# Patient Record
Sex: Male | Born: 1950
Health system: Southern US, Community
[De-identification: ages and names within clinical notes are randomized; demographics above are authoritative.]

## PROBLEM LIST (undated history)

## (undated) DIAGNOSIS — J45909 Unspecified asthma, uncomplicated: Secondary | ICD-10-CM

## (undated) DIAGNOSIS — R519 Headache, unspecified: Secondary | ICD-10-CM

## (undated) DIAGNOSIS — C4491 Basal cell carcinoma of skin, unspecified: Secondary | ICD-10-CM

## (undated) DIAGNOSIS — R1310 Dysphagia, unspecified: Principal | ICD-10-CM

## (undated) DIAGNOSIS — I639 Cerebral infarction, unspecified: Secondary | ICD-10-CM

## (undated) DIAGNOSIS — K219 Gastro-esophageal reflux disease without esophagitis: Secondary | ICD-10-CM

## (undated) DIAGNOSIS — I341 Nonrheumatic mitral (valve) prolapse: Secondary | ICD-10-CM

## (undated) DIAGNOSIS — M533 Sacrococcygeal disorders, not elsewhere classified: Secondary | ICD-10-CM

## (undated) DIAGNOSIS — C801 Malignant (primary) neoplasm, unspecified: Secondary | ICD-10-CM

## (undated) DIAGNOSIS — R51 Headache: Secondary | ICD-10-CM

## (undated) DIAGNOSIS — Z0000 Encounter for general adult medical examination without abnormal findings: Secondary | ICD-10-CM

## (undated) DIAGNOSIS — E782 Mixed hyperlipidemia: Secondary | ICD-10-CM

## (undated) DIAGNOSIS — I340 Nonrheumatic mitral (valve) insufficiency: Secondary | ICD-10-CM

## (undated) DIAGNOSIS — R0781 Pleurodynia: Secondary | ICD-10-CM

## (undated) DIAGNOSIS — R011 Cardiac murmur, unspecified: Secondary | ICD-10-CM

## (undated) DIAGNOSIS — L309 Dermatitis, unspecified: Secondary | ICD-10-CM

## (undated) DIAGNOSIS — E785 Hyperlipidemia, unspecified: Secondary | ICD-10-CM

## (undated) DIAGNOSIS — R351 Nocturia: Secondary | ICD-10-CM

## (undated) DIAGNOSIS — M545 Low back pain: Secondary | ICD-10-CM

## (undated) DIAGNOSIS — T7840XA Allergy, unspecified, initial encounter: Secondary | ICD-10-CM

## (undated) HISTORY — DX: Headache: R51

## (undated) HISTORY — DX: Basal cell carcinoma of skin, unspecified: C44.91

## (undated) HISTORY — DX: Gastro-esophageal reflux disease without esophagitis: K21.9

## (undated) HISTORY — DX: Hyperlipidemia, unspecified: E78.5

## (undated) HISTORY — DX: Sacrococcygeal disorders, not elsewhere classified: M53.3

## (undated) HISTORY — DX: Headache, unspecified: R51.9

## (undated) HISTORY — DX: Cardiac murmur, unspecified: R01.1

## (undated) HISTORY — DX: Dysphagia, unspecified: R13.10

## (undated) HISTORY — DX: Encounter for general adult medical examination without abnormal findings: Z00.00

## (undated) HISTORY — DX: Low back pain: M54.5

## (undated) HISTORY — DX: Allergy, unspecified, initial encounter: T78.40XA

## (undated) HISTORY — DX: Mixed hyperlipidemia: E78.2

## (undated) HISTORY — DX: Dermatitis, unspecified: L30.9

## (undated) HISTORY — DX: Malignant (primary) neoplasm, unspecified: C80.1

## (undated) HISTORY — DX: Pleurodynia: R07.81

## (undated) HISTORY — DX: Nocturia: R35.1

---

## 2013-10-29 DIAGNOSIS — F329 Major depressive disorder, single episode, unspecified: Secondary | ICD-10-CM | POA: Diagnosis not present

## 2013-10-29 DIAGNOSIS — R9430 Abnormal result of cardiovascular function study, unspecified: Secondary | ICD-10-CM | POA: Diagnosis not present

## 2013-10-29 DIAGNOSIS — H908 Mixed conductive and sensorineural hearing loss, unspecified: Secondary | ICD-10-CM | POA: Diagnosis not present

## 2013-10-29 DIAGNOSIS — R0609 Other forms of dyspnea: Secondary | ICD-10-CM | POA: Diagnosis not present

## 2013-10-29 DIAGNOSIS — Z135 Encounter for screening for eye and ear disorders: Secondary | ICD-10-CM | POA: Diagnosis not present

## 2013-10-29 DIAGNOSIS — N400 Enlarged prostate without lower urinary tract symptoms: Secondary | ICD-10-CM | POA: Diagnosis not present

## 2013-10-29 DIAGNOSIS — F3289 Other specified depressive episodes: Secondary | ICD-10-CM | POA: Diagnosis not present

## 2013-10-29 DIAGNOSIS — R030 Elevated blood-pressure reading, without diagnosis of hypertension: Secondary | ICD-10-CM | POA: Diagnosis not present

## 2013-10-29 DIAGNOSIS — Z0000 Encounter for general adult medical examination without abnormal findings: Secondary | ICD-10-CM | POA: Diagnosis not present

## 2013-11-06 DIAGNOSIS — Z0000 Encounter for general adult medical examination without abnormal findings: Secondary | ICD-10-CM | POA: Diagnosis not present

## 2013-11-06 DIAGNOSIS — L821 Other seborrheic keratosis: Secondary | ICD-10-CM | POA: Diagnosis not present

## 2013-11-06 DIAGNOSIS — C44319 Basal cell carcinoma of skin of other parts of face: Secondary | ICD-10-CM | POA: Diagnosis not present

## 2013-11-06 DIAGNOSIS — E7800 Pure hypercholesterolemia, unspecified: Secondary | ICD-10-CM | POA: Diagnosis not present

## 2013-11-06 DIAGNOSIS — I10 Essential (primary) hypertension: Secondary | ICD-10-CM | POA: Diagnosis not present

## 2013-11-06 DIAGNOSIS — L570 Actinic keratosis: Secondary | ICD-10-CM | POA: Diagnosis not present

## 2013-11-06 DIAGNOSIS — D485 Neoplasm of uncertain behavior of skin: Secondary | ICD-10-CM | POA: Diagnosis not present

## 2013-11-06 DIAGNOSIS — E785 Hyperlipidemia, unspecified: Secondary | ICD-10-CM | POA: Diagnosis not present

## 2013-11-12 DIAGNOSIS — R7309 Other abnormal glucose: Secondary | ICD-10-CM | POA: Diagnosis not present

## 2013-11-12 DIAGNOSIS — E236 Other disorders of pituitary gland: Secondary | ICD-10-CM | POA: Diagnosis not present

## 2013-11-12 DIAGNOSIS — E8881 Metabolic syndrome: Secondary | ICD-10-CM | POA: Diagnosis not present

## 2013-11-12 DIAGNOSIS — E785 Hyperlipidemia, unspecified: Secondary | ICD-10-CM | POA: Diagnosis not present

## 2013-12-05 DIAGNOSIS — C44319 Basal cell carcinoma of skin of other parts of face: Secondary | ICD-10-CM | POA: Diagnosis not present

## 2013-12-05 DIAGNOSIS — Z85828 Personal history of other malignant neoplasm of skin: Secondary | ICD-10-CM | POA: Diagnosis not present

## 2014-01-13 DIAGNOSIS — Z85828 Personal history of other malignant neoplasm of skin: Secondary | ICD-10-CM | POA: Diagnosis not present

## 2014-01-13 DIAGNOSIS — C44319 Basal cell carcinoma of skin of other parts of face: Secondary | ICD-10-CM | POA: Diagnosis not present

## 2014-01-13 DIAGNOSIS — L819 Disorder of pigmentation, unspecified: Secondary | ICD-10-CM | POA: Diagnosis not present

## 2014-05-13 DIAGNOSIS — R160 Hepatomegaly, not elsewhere classified: Secondary | ICD-10-CM | POA: Diagnosis not present

## 2014-05-13 DIAGNOSIS — K8689 Other specified diseases of pancreas: Secondary | ICD-10-CM | POA: Diagnosis not present

## 2014-05-13 DIAGNOSIS — I498 Other specified cardiac arrhythmias: Secondary | ICD-10-CM | POA: Diagnosis not present

## 2014-05-13 DIAGNOSIS — R109 Unspecified abdominal pain: Secondary | ICD-10-CM | POA: Diagnosis not present

## 2014-05-13 DIAGNOSIS — I2510 Atherosclerotic heart disease of native coronary artery without angina pectoris: Secondary | ICD-10-CM | POA: Diagnosis not present

## 2014-05-13 DIAGNOSIS — R110 Nausea: Secondary | ICD-10-CM | POA: Diagnosis not present

## 2014-05-13 DIAGNOSIS — K859 Acute pancreatitis without necrosis or infection, unspecified: Secondary | ICD-10-CM | POA: Diagnosis not present

## 2014-05-13 DIAGNOSIS — R1013 Epigastric pain: Secondary | ICD-10-CM | POA: Diagnosis not present

## 2014-05-13 DIAGNOSIS — R1011 Right upper quadrant pain: Secondary | ICD-10-CM | POA: Diagnosis not present

## 2014-05-14 DIAGNOSIS — K859 Acute pancreatitis without necrosis or infection, unspecified: Secondary | ICD-10-CM | POA: Diagnosis not present

## 2014-05-14 DIAGNOSIS — R160 Hepatomegaly, not elsewhere classified: Secondary | ICD-10-CM | POA: Diagnosis not present

## 2014-05-14 DIAGNOSIS — R1011 Right upper quadrant pain: Secondary | ICD-10-CM | POA: Diagnosis not present

## 2014-05-14 DIAGNOSIS — R110 Nausea: Secondary | ICD-10-CM | POA: Diagnosis not present

## 2014-06-17 DIAGNOSIS — M5030 Other cervical disc degeneration, unspecified cervical region: Secondary | ICD-10-CM | POA: Diagnosis not present

## 2014-06-17 DIAGNOSIS — M9981 Other biomechanical lesions of cervical region: Secondary | ICD-10-CM | POA: Diagnosis not present

## 2014-06-17 DIAGNOSIS — M47812 Spondylosis without myelopathy or radiculopathy, cervical region: Secondary | ICD-10-CM | POA: Diagnosis not present

## 2014-06-17 DIAGNOSIS — M999 Biomechanical lesion, unspecified: Secondary | ICD-10-CM | POA: Diagnosis not present

## 2014-06-17 DIAGNOSIS — R51 Headache: Secondary | ICD-10-CM | POA: Diagnosis not present

## 2014-06-23 DIAGNOSIS — M5030 Other cervical disc degeneration, unspecified cervical region: Secondary | ICD-10-CM | POA: Diagnosis not present

## 2014-06-23 DIAGNOSIS — M9981 Other biomechanical lesions of cervical region: Secondary | ICD-10-CM | POA: Diagnosis not present

## 2014-06-23 DIAGNOSIS — M47812 Spondylosis without myelopathy or radiculopathy, cervical region: Secondary | ICD-10-CM | POA: Diagnosis not present

## 2014-06-23 DIAGNOSIS — R51 Headache: Secondary | ICD-10-CM | POA: Diagnosis not present

## 2014-06-23 DIAGNOSIS — M999 Biomechanical lesion, unspecified: Secondary | ICD-10-CM | POA: Diagnosis not present

## 2014-07-08 DIAGNOSIS — M5030 Other cervical disc degeneration, unspecified cervical region: Secondary | ICD-10-CM | POA: Diagnosis not present

## 2014-07-08 DIAGNOSIS — M47812 Spondylosis without myelopathy or radiculopathy, cervical region: Secondary | ICD-10-CM | POA: Diagnosis not present

## 2014-07-08 DIAGNOSIS — M9901 Segmental and somatic dysfunction of cervical region: Secondary | ICD-10-CM | POA: Diagnosis not present

## 2014-07-08 DIAGNOSIS — R51 Headache: Secondary | ICD-10-CM | POA: Diagnosis not present

## 2014-07-08 DIAGNOSIS — M9902 Segmental and somatic dysfunction of thoracic region: Secondary | ICD-10-CM | POA: Diagnosis not present

## 2014-07-31 DIAGNOSIS — M47812 Spondylosis without myelopathy or radiculopathy, cervical region: Secondary | ICD-10-CM | POA: Diagnosis not present

## 2014-07-31 DIAGNOSIS — R51 Headache: Secondary | ICD-10-CM | POA: Diagnosis not present

## 2014-07-31 DIAGNOSIS — M9901 Segmental and somatic dysfunction of cervical region: Secondary | ICD-10-CM | POA: Diagnosis not present

## 2014-07-31 DIAGNOSIS — M5030 Other cervical disc degeneration, unspecified cervical region: Secondary | ICD-10-CM | POA: Diagnosis not present

## 2014-07-31 DIAGNOSIS — M9902 Segmental and somatic dysfunction of thoracic region: Secondary | ICD-10-CM | POA: Diagnosis not present

## 2014-09-23 DIAGNOSIS — M9902 Segmental and somatic dysfunction of thoracic region: Secondary | ICD-10-CM | POA: Diagnosis not present

## 2014-09-23 DIAGNOSIS — M47812 Spondylosis without myelopathy or radiculopathy, cervical region: Secondary | ICD-10-CM | POA: Diagnosis not present

## 2014-09-23 DIAGNOSIS — M9901 Segmental and somatic dysfunction of cervical region: Secondary | ICD-10-CM | POA: Diagnosis not present

## 2014-09-23 DIAGNOSIS — M5030 Other cervical disc degeneration, unspecified cervical region: Secondary | ICD-10-CM | POA: Diagnosis not present

## 2014-09-23 DIAGNOSIS — R51 Headache: Secondary | ICD-10-CM | POA: Diagnosis not present

## 2014-10-20 DIAGNOSIS — J45998 Other asthma: Secondary | ICD-10-CM | POA: Diagnosis not present

## 2014-10-20 DIAGNOSIS — J301 Allergic rhinitis due to pollen: Secondary | ICD-10-CM | POA: Diagnosis not present

## 2014-10-20 DIAGNOSIS — Z8719 Personal history of other diseases of the digestive system: Secondary | ICD-10-CM | POA: Diagnosis not present

## 2014-11-04 DIAGNOSIS — M47812 Spondylosis without myelopathy or radiculopathy, cervical region: Secondary | ICD-10-CM | POA: Diagnosis not present

## 2014-11-04 DIAGNOSIS — M9901 Segmental and somatic dysfunction of cervical region: Secondary | ICD-10-CM | POA: Diagnosis not present

## 2014-11-04 DIAGNOSIS — M9902 Segmental and somatic dysfunction of thoracic region: Secondary | ICD-10-CM | POA: Diagnosis not present

## 2014-11-04 DIAGNOSIS — R51 Headache: Secondary | ICD-10-CM | POA: Diagnosis not present

## 2014-11-04 DIAGNOSIS — M5030 Other cervical disc degeneration, unspecified cervical region: Secondary | ICD-10-CM | POA: Diagnosis not present

## 2014-12-16 DIAGNOSIS — R51 Headache: Secondary | ICD-10-CM | POA: Diagnosis not present

## 2014-12-16 DIAGNOSIS — M9901 Segmental and somatic dysfunction of cervical region: Secondary | ICD-10-CM | POA: Diagnosis not present

## 2014-12-16 DIAGNOSIS — M9902 Segmental and somatic dysfunction of thoracic region: Secondary | ICD-10-CM | POA: Diagnosis not present

## 2014-12-16 DIAGNOSIS — M47812 Spondylosis without myelopathy or radiculopathy, cervical region: Secondary | ICD-10-CM | POA: Diagnosis not present

## 2014-12-16 DIAGNOSIS — M5030 Other cervical disc degeneration, unspecified cervical region: Secondary | ICD-10-CM | POA: Diagnosis not present

## 2015-01-20 DIAGNOSIS — M5030 Other cervical disc degeneration, unspecified cervical region: Secondary | ICD-10-CM | POA: Diagnosis not present

## 2015-01-20 DIAGNOSIS — M47812 Spondylosis without myelopathy or radiculopathy, cervical region: Secondary | ICD-10-CM | POA: Diagnosis not present

## 2015-01-20 DIAGNOSIS — M9902 Segmental and somatic dysfunction of thoracic region: Secondary | ICD-10-CM | POA: Diagnosis not present

## 2015-01-20 DIAGNOSIS — R51 Headache: Secondary | ICD-10-CM | POA: Diagnosis not present

## 2015-01-20 DIAGNOSIS — Z23 Encounter for immunization: Secondary | ICD-10-CM | POA: Diagnosis not present

## 2015-01-20 DIAGNOSIS — M9901 Segmental and somatic dysfunction of cervical region: Secondary | ICD-10-CM | POA: Diagnosis not present

## 2015-02-24 DIAGNOSIS — Z23 Encounter for immunization: Secondary | ICD-10-CM | POA: Diagnosis not present

## 2015-03-03 DIAGNOSIS — M9901 Segmental and somatic dysfunction of cervical region: Secondary | ICD-10-CM | POA: Diagnosis not present

## 2015-03-03 DIAGNOSIS — M791 Myalgia: Secondary | ICD-10-CM | POA: Diagnosis not present

## 2015-03-03 DIAGNOSIS — M47816 Spondylosis without myelopathy or radiculopathy, lumbar region: Secondary | ICD-10-CM | POA: Diagnosis not present

## 2015-03-03 DIAGNOSIS — M9902 Segmental and somatic dysfunction of thoracic region: Secondary | ICD-10-CM | POA: Diagnosis not present

## 2015-03-03 DIAGNOSIS — M9903 Segmental and somatic dysfunction of lumbar region: Secondary | ICD-10-CM | POA: Diagnosis not present

## 2015-03-03 DIAGNOSIS — M47812 Spondylosis without myelopathy or radiculopathy, cervical region: Secondary | ICD-10-CM | POA: Diagnosis not present

## 2015-04-21 DIAGNOSIS — M791 Myalgia: Secondary | ICD-10-CM | POA: Diagnosis not present

## 2015-04-21 DIAGNOSIS — M9903 Segmental and somatic dysfunction of lumbar region: Secondary | ICD-10-CM | POA: Diagnosis not present

## 2015-04-21 DIAGNOSIS — M9902 Segmental and somatic dysfunction of thoracic region: Secondary | ICD-10-CM | POA: Diagnosis not present

## 2015-04-21 DIAGNOSIS — M47812 Spondylosis without myelopathy or radiculopathy, cervical region: Secondary | ICD-10-CM | POA: Diagnosis not present

## 2015-04-21 DIAGNOSIS — M47816 Spondylosis without myelopathy or radiculopathy, lumbar region: Secondary | ICD-10-CM | POA: Diagnosis not present

## 2015-04-21 DIAGNOSIS — M9901 Segmental and somatic dysfunction of cervical region: Secondary | ICD-10-CM | POA: Diagnosis not present

## 2015-06-10 DIAGNOSIS — M9901 Segmental and somatic dysfunction of cervical region: Secondary | ICD-10-CM | POA: Diagnosis not present

## 2015-06-10 DIAGNOSIS — M47812 Spondylosis without myelopathy or radiculopathy, cervical region: Secondary | ICD-10-CM | POA: Diagnosis not present

## 2015-06-10 DIAGNOSIS — M791 Myalgia: Secondary | ICD-10-CM | POA: Diagnosis not present

## 2015-06-10 DIAGNOSIS — M9902 Segmental and somatic dysfunction of thoracic region: Secondary | ICD-10-CM | POA: Diagnosis not present

## 2015-06-10 DIAGNOSIS — M47816 Spondylosis without myelopathy or radiculopathy, lumbar region: Secondary | ICD-10-CM | POA: Diagnosis not present

## 2015-06-10 DIAGNOSIS — M9903 Segmental and somatic dysfunction of lumbar region: Secondary | ICD-10-CM | POA: Diagnosis not present

## 2015-08-03 DIAGNOSIS — M47812 Spondylosis without myelopathy or radiculopathy, cervical region: Secondary | ICD-10-CM | POA: Diagnosis not present

## 2015-08-03 DIAGNOSIS — M791 Myalgia: Secondary | ICD-10-CM | POA: Diagnosis not present

## 2015-08-03 DIAGNOSIS — M9902 Segmental and somatic dysfunction of thoracic region: Secondary | ICD-10-CM | POA: Diagnosis not present

## 2015-08-03 DIAGNOSIS — M9901 Segmental and somatic dysfunction of cervical region: Secondary | ICD-10-CM | POA: Diagnosis not present

## 2015-08-03 DIAGNOSIS — M47816 Spondylosis without myelopathy or radiculopathy, lumbar region: Secondary | ICD-10-CM | POA: Diagnosis not present

## 2015-08-03 DIAGNOSIS — M9903 Segmental and somatic dysfunction of lumbar region: Secondary | ICD-10-CM | POA: Diagnosis not present

## 2015-09-29 DIAGNOSIS — M47812 Spondylosis without myelopathy or radiculopathy, cervical region: Secondary | ICD-10-CM | POA: Diagnosis not present

## 2015-09-29 DIAGNOSIS — M9903 Segmental and somatic dysfunction of lumbar region: Secondary | ICD-10-CM | POA: Diagnosis not present

## 2015-09-29 DIAGNOSIS — M9902 Segmental and somatic dysfunction of thoracic region: Secondary | ICD-10-CM | POA: Diagnosis not present

## 2015-09-29 DIAGNOSIS — M47816 Spondylosis without myelopathy or radiculopathy, lumbar region: Secondary | ICD-10-CM | POA: Diagnosis not present

## 2015-09-29 DIAGNOSIS — M791 Myalgia: Secondary | ICD-10-CM | POA: Diagnosis not present

## 2015-09-29 DIAGNOSIS — M9901 Segmental and somatic dysfunction of cervical region: Secondary | ICD-10-CM | POA: Diagnosis not present

## 2015-10-05 ENCOUNTER — Ambulatory Visit: Payer: Self-pay | Admitting: Family Medicine

## 2015-11-11 DIAGNOSIS — M47812 Spondylosis without myelopathy or radiculopathy, cervical region: Secondary | ICD-10-CM | POA: Diagnosis not present

## 2015-11-11 DIAGNOSIS — M9903 Segmental and somatic dysfunction of lumbar region: Secondary | ICD-10-CM | POA: Diagnosis not present

## 2015-11-11 DIAGNOSIS — M47816 Spondylosis without myelopathy or radiculopathy, lumbar region: Secondary | ICD-10-CM | POA: Diagnosis not present

## 2015-11-11 DIAGNOSIS — M9901 Segmental and somatic dysfunction of cervical region: Secondary | ICD-10-CM | POA: Diagnosis not present

## 2015-11-11 DIAGNOSIS — M791 Myalgia: Secondary | ICD-10-CM | POA: Diagnosis not present

## 2015-11-11 DIAGNOSIS — M9902 Segmental and somatic dysfunction of thoracic region: Secondary | ICD-10-CM | POA: Diagnosis not present

## 2015-11-26 ENCOUNTER — Ambulatory Visit: Payer: Self-pay | Admitting: Family Medicine

## 2015-12-07 ENCOUNTER — Telehealth: Payer: Self-pay

## 2015-12-07 DIAGNOSIS — M9901 Segmental and somatic dysfunction of cervical region: Secondary | ICD-10-CM | POA: Diagnosis not present

## 2015-12-07 DIAGNOSIS — M9902 Segmental and somatic dysfunction of thoracic region: Secondary | ICD-10-CM | POA: Diagnosis not present

## 2015-12-07 DIAGNOSIS — M47812 Spondylosis without myelopathy or radiculopathy, cervical region: Secondary | ICD-10-CM | POA: Diagnosis not present

## 2015-12-07 DIAGNOSIS — M47816 Spondylosis without myelopathy or radiculopathy, lumbar region: Secondary | ICD-10-CM | POA: Diagnosis not present

## 2015-12-07 DIAGNOSIS — M791 Myalgia: Secondary | ICD-10-CM | POA: Diagnosis not present

## 2015-12-07 DIAGNOSIS — M9903 Segmental and somatic dysfunction of lumbar region: Secondary | ICD-10-CM | POA: Diagnosis not present

## 2015-12-07 NOTE — Telephone Encounter (Signed)
Pre Visit call completed with patient.  

## 2015-12-08 ENCOUNTER — Ambulatory Visit (INDEPENDENT_AMBULATORY_CARE_PROVIDER_SITE_OTHER): Payer: Medicare Other | Admitting: Family Medicine

## 2015-12-08 ENCOUNTER — Encounter: Payer: Self-pay | Admitting: Family Medicine

## 2015-12-08 VITALS — BP 132/80 | HR 64 | Temp 98.4°F | Ht 68.0 in | Wt 162.2 lb

## 2015-12-08 DIAGNOSIS — T7840XA Allergy, unspecified, initial encounter: Secondary | ICD-10-CM

## 2015-12-08 DIAGNOSIS — M545 Low back pain, unspecified: Secondary | ICD-10-CM

## 2015-12-08 DIAGNOSIS — R0781 Pleurodynia: Secondary | ICD-10-CM | POA: Diagnosis not present

## 2015-12-08 DIAGNOSIS — K219 Gastro-esophageal reflux disease without esophagitis: Secondary | ICD-10-CM

## 2015-12-08 DIAGNOSIS — E782 Mixed hyperlipidemia: Secondary | ICD-10-CM

## 2015-12-08 DIAGNOSIS — Z1211 Encounter for screening for malignant neoplasm of colon: Secondary | ICD-10-CM | POA: Insufficient documentation

## 2015-12-08 DIAGNOSIS — R0789 Other chest pain: Secondary | ICD-10-CM | POA: Insufficient documentation

## 2015-12-08 DIAGNOSIS — C4491 Basal cell carcinoma of skin, unspecified: Secondary | ICD-10-CM

## 2015-12-08 DIAGNOSIS — R1310 Dysphagia, unspecified: Secondary | ICD-10-CM | POA: Diagnosis not present

## 2015-12-08 DIAGNOSIS — R011 Cardiac murmur, unspecified: Secondary | ICD-10-CM

## 2015-12-08 DIAGNOSIS — Z8719 Personal history of other diseases of the digestive system: Secondary | ICD-10-CM

## 2015-12-08 DIAGNOSIS — Z0000 Encounter for general adult medical examination without abnormal findings: Secondary | ICD-10-CM

## 2015-12-08 HISTORY — DX: Low back pain, unspecified: M54.50

## 2015-12-08 HISTORY — DX: Pleurodynia: R07.81

## 2015-12-08 HISTORY — DX: Other chest pain: R07.89

## 2015-12-08 HISTORY — DX: Mixed hyperlipidemia: E78.2

## 2015-12-08 HISTORY — DX: Dysphagia, unspecified: R13.10

## 2015-12-08 HISTORY — DX: Basal cell carcinoma of skin, unspecified: C44.91

## 2015-12-08 HISTORY — DX: Allergy, unspecified, initial encounter: T78.40XA

## 2015-12-08 HISTORY — DX: Gastro-esophageal reflux disease without esophagitis: K21.9

## 2015-12-08 HISTORY — DX: Encounter for general adult medical examination without abnormal findings: Z00.00

## 2015-12-08 NOTE — Progress Notes (Signed)
Pre visit review using our clinic review tool, if applicable. No additional management support is needed unless otherwise documented below in the visit note. 

## 2015-12-08 NOTE — Assessment & Plan Note (Signed)
Patient encouraged to maintain heart healthy diet, regular exercise, adequate sleep. Consider daily probiotics. Take medications as prescribed 

## 2015-12-08 NOTE — Assessment & Plan Note (Addendum)
Had an episode of pancreatitis from a gallstone obstructing, has not recurred and did not have his gallbladder removed. Will consider repeat imaging if pain recurs, minimize fatty foods

## 2015-12-08 NOTE — Assessment & Plan Note (Addendum)
Avoid offending foods, start probiotics. Do not eat large meals in late evening and consider raising head of bed. Uses Rolaids several times a week.

## 2015-12-08 NOTE — Progress Notes (Signed)
Subjective:    Patient ID: Austin Ramirez., male    DOB: [birth year], 65 y.o.   MRN: [id number]  Chief Complaint  Patient presents with  . Establish Care    HPI Patient is in today for New patient establish care.  He feels well today. No recent illness but does note a PMH consisting of hyperlipidemia, gallstones, reflux, back pain, rib/chest pain, allergies. He is taking Rolaids several times a week with relief. Has some intermittent back pain, no recent fall but has a history of injuries falling off ov mountain bikes and as an ultra marathon runner. Is noting some recent episodes of dysphagia. Denies palp/SOB/HA/congestion/fevers or GU c/o. Taking meds as prescribed  Past Medical History  Diagnosis Date  . Cancer ([place])     skin cancer  . Frequent headaches   . GERD (gastroesophageal reflux disease)   . Allergy   . Heart murmur   . Hyperlipidemia   . Rib pain 12/08/2015  . Preventative health care 12/08/2015  . Low back pain 12/08/2015  . Esophageal reflux 12/08/2015  . Hyperlipidemia, mixed 12/08/2015  . Allergic state 12/08/2015  . BCC (basal cell carcinoma of skin) 12/08/2015  . Dysphagia 12/08/2015    No past surgical history on file.  Family History  Problem Relation Age of Onset  . Arthritis Mother   . Heart disease Mother   . Arthritis Father   . Heart disease Father   . Hypertension Father     Social History   Social History  . Marital Status: Married    Spouse Name: N/A  . Number of Children: N/A  . Years of Education: N/A   Occupational History  . Retired    Social History Main Topics  . Smoking status: Never Smoker   . Smokeless tobacco: Not on file  . Alcohol Use: 0.0 oz/week    0 Standard drinks or equivalent per week  . Drug Use: No  . Sexual Activity: Not on file     Comment: lives with wife, ultra marathon runner, retired from [employer] work,    Other Topics Concern  . Not on file   Social History Narrative  . No narrative on file     No outpatient prescriptions prior to visit.   No facility-administered medications prior to visit.    Allergies  Allergen Reactions  . Almond Meal Swelling    Review of Systems  Constitutional: Negative for fever and malaise/fatigue.  HENT: Negative for congestion.   Eyes: Negative for blurred vision.  Respiratory: Negative for shortness of breath.   Cardiovascular: Positive for chest pain. Negative for palpitations and leg swelling.  Gastrointestinal: Positive for heartburn. Negative for nausea, abdominal pain and blood in stool.  Genitourinary: Negative for dysuria and frequency.  Musculoskeletal: Positive for back pain. Negative for falls.  Skin: Negative for rash.  Neurological: Negative for dizziness, loss of consciousness and headaches.  Endo/Heme/Allergies: Negative for environmental allergies.  Psychiatric/Behavioral: Negative for depression. The patient is not nervous/anxious.        Objective:    Physical Exam  Constitutional: He is oriented to person, place, and time. He appears well-developed and well-nourished. No distress.  HENT:  Head: Normocephalic and atraumatic.  Eyes: Conjunctivae are normal.  Neck: Neck supple. No thyromegaly present.  Cardiovascular: Normal rate and regular rhythm.   Murmur heard. Pulmonary/Chest: Effort normal and breath sounds normal. No respiratory distress. He has no wheezes.  Abdominal: Soft. Bowel sounds are normal. He exhibits no mass. There is  no tenderness.  Musculoskeletal: He exhibits no edema.  Lymphadenopathy:    He has no cervical adenopathy.  Neurological: He is alert and oriented to person, place, and time.  Skin: Skin is warm and dry.  Psychiatric: He has a normal mood and affect. His behavior is normal.    BP 132/80 mmHg  Pulse 64  Temp(Src) 98.4 F (36.9 C) (Oral)  Ht 5\' 8"  (1.727 m)  Wt 162 lb 4 oz (73.596 kg)  BMI 24.68 kg/m2  SpO2 95% Wt Readings from Last 3 Encounters:  12/08/15 162 lb 4 oz  (73.596 kg)     Lab Results  Component Value Date   WBC 7.3 12/08/2015   HGB 13.9 12/08/2015   HCT 41.1 12/08/2015   PLT 238.0 12/08/2015   GLUCOSE 85 12/08/2015   CHOL 265* 12/08/2015   TRIG 262.0* 12/08/2015   HDL 91.40 12/08/2015   LDLDIRECT 144.0 12/08/2015   ALT 19 12/08/2015   AST 34 12/08/2015   NA 140 12/08/2015   K 4.2 12/08/2015   CL 103 12/08/2015   CREATININE 1.12 12/08/2015   BUN 18 12/08/2015   CO2 30 12/08/2015   TSH 2.68 12/08/2015    Lab Results  Component Value Date   TSH 2.68 12/08/2015   Lab Results  Component Value Date   WBC 7.3 12/08/2015   HGB 13.9 12/08/2015   HCT 41.1 12/08/2015   MCV 92.7 12/08/2015   PLT 238.0 12/08/2015   Lab Results  Component Value Date   NA 140 12/08/2015   K 4.2 12/08/2015   CO2 30 12/08/2015   GLUCOSE 85 12/08/2015   BUN 18 12/08/2015   CREATININE 1.12 12/08/2015   BILITOT 0.6 12/08/2015   ALKPHOS 63 12/08/2015   AST 34 12/08/2015   ALT 19 12/08/2015   PROT 7.4 12/08/2015   ALBUMIN 4.7 12/08/2015   CALCIUM 10.0 12/08/2015   GFR 69.48 12/08/2015   Lab Results  Component Value Date   CHOL 265* 12/08/2015   Lab Results  Component Value Date   HDL 91.40 12/08/2015   No results found for: [facility] Lab Results  Component Value Date   TRIG 262.0* 12/08/2015   Lab Results  Component Value Date   CHOLHDL 3 12/08/2015   No results found for: HGBA1C     Assessment & Plan:   Problem List Items Addressed This Visit    Allergic state   BCC (basal cell carcinoma of skin)    Referred to dermatology for surveillance due to history, no new lesions today      Relevant Medications   aspirin 81 MG tablet   Other Relevant Orders   Ambulatory referral to Dermatology   Dysphagia - Primary    Has noted increased incidence of food catching about 1/2 way down when swallowing. Is referred to gastroenterology for consideration      Relevant Orders   Ambulatory referral to Dermatology   Esophageal reflux     Avoid offending foods, start probiotics. Do not eat large meals in late evening and consider raising head of bed. Uses Rolaids several times a week.      Relevant Medications   Multiple Vitamin (MULTIVITAMIN) tablet   Omega-3 Fatty Acids (FISH OIL PO)   glucosamine-chondroitin 500-400 MG tablet   multivitamin-lutein (OCUVITE-LUTEIN) CAPS capsule   aspirin 81 MG tablet   DHEA 10 MG CAPS   b complex vitamins capsule   Other Relevant Orders   CBC (Completed)   Comprehensive metabolic panel (Completed)   TSH (  Completed)   Lipid panel (Completed)   History of gallstones    Had an episode of pancreatitis from a gallstone obstructing, has not recurred and did not have his gallbladder removed. Will consider repeat imaging if pain recurs, minimize fatty foods      Relevant Medications   Multiple Vitamin (MULTIVITAMIN) tablet   Omega-3 Fatty Acids (FISH OIL PO)   glucosamine-chondroitin 500-400 MG tablet   multivitamin-lutein (OCUVITE-LUTEIN) CAPS capsule   aspirin 81 MG tablet   DHEA 10 MG CAPS   b complex vitamins capsule   Other Relevant Orders   CBC (Completed)   Comprehensive metabolic panel (Completed)   TSH (Completed)   Lipid panel (Completed)   Hyperlipidemia, mixed    Encouraged heart healthy diet, increase exercise, avoid trans fats, consider a krill oil cap daily. Patient hesitant to start meds.      Relevant Medications   Multiple Vitamin (MULTIVITAMIN) tablet   Omega-3 Fatty Acids (FISH OIL PO)   glucosamine-chondroitin 500-400 MG tablet   multivitamin-lutein (OCUVITE-LUTEIN) CAPS capsule   aspirin 81 MG tablet   DHEA 10 MG CAPS   b complex vitamins capsule   Other Relevant Orders   CBC (Completed)   Comprehensive metabolic panel (Completed)   TSH (Completed)   Lipid panel (Completed)   Low back pain   Relevant Medications   Multiple Vitamin (MULTIVITAMIN) tablet   Omega-3 Fatty Acids (FISH OIL PO)   glucosamine-chondroitin 500-400 MG tablet    multivitamin-lutein (OCUVITE-LUTEIN) CAPS capsule   aspirin 81 MG tablet   DHEA 10 MG CAPS   b complex vitamins capsule   Other Relevant Orders   CBC (Completed)   Comprehensive metabolic panel (Completed)   TSH (Completed)   Lipid panel (Completed)   Preventative health care    Patient encouraged to maintain heart healthy diet, regular exercise, adequate sleep. Consider daily probiotics. 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 His pain is intermittent along lower ribs, is b/l and occurs after strenuous exercise. Patient not interested in imaging at this time. Try topical treatments.      Relevant Medications   Multiple Vitamin (MULTIVITAMIN) tablet   Omega-3 Fatty Acids (FISH OIL PO)   glucosamine-chondroitin 500-400 MG tablet   multivitamin-lutein (OCUVITE-LUTEIN) CAPS capsule   aspirin 81 MG tablet   DHEA 10 MG CAPS   b complex vitamins capsule   Other Relevant Orders   CBC (Completed)   Comprehensive metabolic panel (Completed)   TSH (Completed)   Lipid panel (Completed)    Other Visit Diagnoses    Heart murmur        Relevant Orders    Echocardiogram       I am having Mr. Challis maintain his multivitamin, Omega-3 Fatty Acids (FISH OIL PO), glucosamine-chondroitin, multivitamin-lutein, aspirin, DHEA, and b complex vitamins.  Meds ordered this encounter  Medications  . Multiple Vitamin (MULTIVITAMIN) tablet  Sig:  Take 1 tablet by mouth daily.  . Omega-3 Fatty Acids (FISH OIL PO)    Sig: Take by mouth 3 x daily with food.  Marland Kitchen glucosamine-chondroitin 500-400 MG tablet    Sig: Take 1 tablet by mouth daily.  . multivitamin-lutein (OCUVITE-LUTEIN) CAPS capsule    Sig: Take 1 capsule by mouth daily.  Marland Kitchen aspirin 81 MG tablet    Sig: Three times a week  . DHEA 10 MG CAPS    Sig: Take by mouth daily.  Marland Kitchen b complex vitamins capsule    Sig: Take 1 capsule by mouth daily.     Penni Homans, MD

## 2015-12-08 NOTE — Patient Instructions (Addendum)
Try a probiotic daily such as [place] in [place]. Preventive Care for Adults, Male A healthy lifestyle and preventive care can promote health and wellness. Preventive health guidelines for men include the following key practices:  A routine yearly physical is a good way to check with your health care provider about your health and preventative screening. It is a chance to share any concerns and updates on your health and to receive a thorough exam.  Visit your dentist for a routine exam and preventative care every 6 months. Brush your teeth twice a day and floss once a day. Good oral hygiene prevents tooth decay and gum disease.  The frequency of eye exams is based on your age, health, family medical history, use of contact lenses, and other factors. Follow your health care provider's recommendations for frequency of eye exams.  Eat a healthy diet. Foods such as vegetables, fruits, whole grains, low-fat dairy products, and lean protein foods contain the nutrients you need without too many calories. Decrease your intake of foods high in solid fats, added sugars, and salt. Eat the right amount of calories for you.Get information about a proper diet from your health care provider, if necessary.  Regular physical exercise is one of the most important things you can do for your health. Most adults should get at least 150 minutes of moderate-intensity exercise (any activity that increases your heart rate and causes you to sweat) each week. In addition, most adults need muscle-strengthening exercises on 2 or more days a week.  Maintain a healthy weight. The body mass index (BMI) is a screening tool to identify possible weight problems. It provides an estimate of body fat based on height and weight. Your health care provider can find your BMI and can help you achieve or maintain a healthy weight.For adults 20 years and older:  A BMI below 18.5 is considered  underweight.  A BMI of 18.5 to 24.9 is normal.  A BMI of 25 to 29.9 is considered overweight.  A BMI of 30 and above is considered obese.  Maintain normal blood lipids and cholesterol levels by exercising and minimizing your intake of saturated fat. Eat a balanced diet with plenty of fruit and vegetables. Blood tests for lipids and cholesterol should begin at age 81 and be repeated every 5 years. If your lipid or cholesterol levels are high, you are over 50, or you are at high risk for heart disease, you may need your cholesterol levels checked more frequently.Ongoing high lipid and cholesterol levels should be treated with medicines if diet and exercise are not working.  If you smoke, find out from your health care provider how to quit. If you do not use tobacco, do not start.  Lung cancer screening is recommended for adults aged 31-80 years who are at high risk for developing lung cancer because of a history of smoking. A yearly low-dose CT scan of the lungs is recommended for people who have at least a 30-pack-year history of smoking and are a current smoker or have quit within the past 15 years. A pack year of smoking is smoking an average of 1 pack of cigarettes a day for 1 year (for example: 1 pack a day for 30 years or 2 packs a day for 15 years). Yearly screening should continue until the smoker has stopped smoking for at least 15 years. Yearly screening should be stopped for people who develop a health problem that would prevent them from having lung  cancer treatment.  If you choose to drink alcohol, do not have more than 2 drinks per day. One drink is considered to be 12 ounces (355 mL) of beer, 5 ounces (148 mL) of wine, or 1.5 ounces (44 mL) of liquor.  Avoid use of street drugs. Do not share needles with anyone. Ask for help if you need support or instructions about stopping the use of drugs.  High blood pressure causes heart disease and increases the risk of stroke. Your blood  pressure should be checked at least every 1-2 years. Ongoing high blood pressure should be treated with medicines, if weight loss and exercise are not effective.  If you are 48-15 years old, ask your health care provider if you should take aspirin to prevent heart disease.  Diabetes screening is done by taking a blood sample to check your blood glucose level after you have not eaten for a certain period of time (fasting). If you are not overweight and you do not have risk factors for diabetes, you should be screened once every 3 years starting at age 58. If you are overweight or obese and you are 33-68 years of age, you should be screened for diabetes every year as part of your cardiovascular risk assessment.  Colorectal cancer can be detected and often prevented. Most routine colorectal cancer screening begins at the age of 37 and continues through age 19. However, your health care provider may recommend screening at an earlier age if you have risk factors for colon cancer. On a yearly basis, your health care provider may provide home test kits to check for hidden blood in the stool. Use of a small camera at the end of a tube to directly examine the colon (sigmoidoscopy or colonoscopy) can detect the earliest forms of colorectal cancer. Talk to your health care provider about this at age 17, when routine screening begins. Direct exam of the colon should be repeated every 5-10 years through age 87, unless early forms of precancerous polyps or small growths are found.  People who are at an increased risk for hepatitis B should be screened for this virus. You are considered at high risk for hepatitis B if:  You were born in a country where hepatitis B occurs often. Talk with your health care provider about which countries are considered high risk.  Your parents were born in a high-risk country and you have not received a shot to protect against hepatitis B (hepatitis B vaccine).  You have HIV or  AIDS.  You use needles to inject street drugs.  You live with, or have sex with, someone who has hepatitis B.  You are a man who has sex with other men (MSM).  You get hemodialysis treatment.  You take certain medicines for conditions such as cancer, organ transplantation, and autoimmune conditions.  Hepatitis C blood testing is recommended for all people born from [birth year] through 1965 and any individual with known risks for hepatitis C.  Practice safe sex. Use condoms and avoid high-risk sexual practices to reduce the spread of sexually transmitted infections (STIs). STIs include gonorrhea, chlamydia, syphilis, trichomonas, herpes, HPV, and human immunodeficiency virus (HIV). Herpes, HIV, and HPV are viral illnesses that have no cure. They can result in disability, cancer, and death.  If you are a man who has sex with other men, you should be screened at least once per year for:  HIV.  Urethral, rectal, and pharyngeal infection of gonorrhea, chlamydia, or both.  If you are at  risk of being infected with HIV, it is recommended that you take a prescription medicine daily to prevent HIV infection. This is called preexposure prophylaxis (PrEP). You are considered at risk if:  You are a man who has sex with other men (MSM) and have other risk factors.  You are a heterosexual man, are sexually active, and are at increased risk for HIV infection.  You take drugs by injection.  You are sexually active with a partner who has HIV.  Talk with your health care provider about whether you are at high risk of being infected with HIV. If you choose to begin PrEP, you should first be tested for HIV. You should then be tested every 3 months for as long as you are taking PrEP.  A one-time screening for abdominal aortic aneurysm (AAA) and surgical repair of large AAAs by ultrasound are recommended for men ages 16 to 23 years who are current or former smokers.  Healthy men should no longer receive  prostate-specific antigen (PSA) blood tests as part of routine cancer screening. Talk with your health care provider about prostate cancer screening.  Testicular cancer screening is not recommended for adult males who have no symptoms. Screening includes self-exam, a health care provider exam, and other screening tests. Consult with your health care provider about any symptoms you have or any concerns you have about testicular cancer.  Use sunscreen. Apply sunscreen liberally and repeatedly throughout the day. You should seek shade when your shadow is shorter than you. Protect yourself by wearing long sleeves, pants, a wide-brimmed hat, and sunglasses year round, whenever you are outdoors.  Once a month, do a whole-body skin exam, using a mirror to look at the skin on your back. Tell your health care provider about new moles, moles that have irregular borders, moles that are larger than a pencil eraser, or moles that have changed in shape or color.  Stay current with required vaccines (immunizations).  Influenza vaccine. All adults should be immunized every year.  Tetanus, diphtheria, and acellular pertussis (Td, Tdap) vaccine. An adult who has not previously received Tdap or who does not know his vaccine status should receive 1 dose of Tdap. This initial dose should be followed by tetanus and diphtheria toxoids (Td) booster doses every 10 years. Adults with an unknown or incomplete history of completing a 3-dose immunization series with Td-containing vaccines should begin or complete a primary immunization series including a Tdap dose. Adults should receive a Td booster every 10 years.  Varicella vaccine. An adult without evidence of immunity to varicella should receive 2 doses or a second dose if he has previously received 1 dose.  Human papillomavirus (HPV) vaccine. Males aged 11-21 years who have not received the vaccine previously should receive the 3-dose series. Males aged 22-26 years may be  immunized. Immunization is recommended through the age of 61 years for any male who has sex with males and did not get any or all doses earlier. Immunization is recommended for any person with an immunocompromised condition through the age of 43 years if he did not get any or all doses earlier. During the 3-dose series, the second dose should be obtained 4-8 weeks after the first dose. The third dose should be obtained 24 weeks after the first dose and 16 weeks after the second dose.  Zoster vaccine. One dose is recommended for adults aged 63 years or older unless certain conditions are present.  Measles, mumps, and rubella (MMR) vaccine. Adults born before  1959 generally are considered immune to measles and mumps. Adults born in [birth year] or later should have 1 or more doses of MMR vaccine unless there is a contraindication to the vaccine or there is laboratory evidence of immunity to each of the three diseases. A routine second dose of MMR vaccine should be obtained at least 28 days after the first dose for students attending postsecondary schools, health care workers, or international travelers. People who received inactivated measles vaccine or an unknown type of measles vaccine during 1963-1967 should receive 2 doses of MMR vaccine. People who received inactivated mumps vaccine or an unknown type of mumps vaccine before 1979 and are at high risk for mumps infection should consider immunization with 2 doses of MMR vaccine. Unvaccinated health care workers born before [birth year] who lack laboratory evidence of measles, mumps, or rubella immunity or laboratory confirmation of disease should consider measles and mumps immunization with 2 doses of MMR vaccine or rubella immunization with 1 dose of MMR vaccine.  Pneumococcal 13-valent conjugate (PCV13) vaccine. When indicated, a person who is uncertain of his immunization history and has no record of immunization should receive the PCV13 vaccine. All adults 3 years of  age and older should receive this vaccine. An adult aged 49 years or older who has certain medical conditions and has not been previously immunized should receive 1 dose of PCV13 vaccine. This PCV13 should be followed with a dose of pneumococcal polysaccharide (PPSV23) vaccine. Adults who are at high risk for pneumococcal disease should obtain the PPSV23 vaccine at least 8 weeks after the dose of PCV13 vaccine. Adults older than 66 years of age who have normal immune system function should obtain the PPSV23 vaccine dose at least 1 year after the dose of PCV13 vaccine.  Pneumococcal polysaccharide (PPSV23) vaccine. When PCV13 is also indicated, PCV13 should be obtained first. All adults aged 67 years and older should be immunized. An adult younger than age 32 years who has certain medical conditions should be immunized. Any person who resides in a nursing home or long-term care facility should be immunized. An adult smoker should be immunized. People with an immunocompromised condition and certain other conditions should receive both PCV13 and PPSV23 vaccines. People with human immunodeficiency virus (HIV) infection should be immunized as soon as possible after diagnosis. Immunization during chemotherapy or radiation therapy should be avoided. Routine use of PPSV23 vaccine is not recommended for American Indians, [place] Natives, or people younger than 65 years unless there are medical conditions that require PPSV23 vaccine. When indicated, people who have unknown immunization and have no record of immunization should receive PPSV23 vaccine. One-time revaccination 5 years after the first dose of PPSV23 is recommended for people aged 19-64 years who have chronic kidney failure, nephrotic syndrome, asplenia, or immunocompromised conditions. People who received 1-2 doses of PPSV23 before age 56 years should receive another dose of PPSV23 vaccine at age 47 years or later if at least 5 years have passed since the  previous dose. Doses of PPSV23 are not needed for people immunized with PPSV23 at or after age 43 years.  Meningococcal vaccine. Adults with asplenia or persistent complement component deficiencies should receive 2 doses of quadrivalent meningococcal conjugate (MenACWY-D) vaccine. The doses should be obtained at least 2 months apart. Microbiologists working with certain meningococcal bacteria, [place] recruits, people at risk during an outbreak, and people who travel to or live in countries with a high rate of meningitis should be immunized. A first-year college student up through  age 18 years who is living in a residence hall should receive a dose if he did not receive a dose on or after his 16th birthday. Adults who have certain high-risk conditions should receive one or more doses of vaccine.  Hepatitis A vaccine. Adults who wish to be protected from this disease, have chronic liver disease, work with hepatitis A-infected animals, work in hepatitis A research labs, or travel to or work in countries with a high rate of hepatitis A should be immunized. Adults who were previously unvaccinated and who anticipate close contact with an international adoptee during the first 60 days after arrival in the [place] from a country with a high rate of hepatitis A should be immunized.  Hepatitis B vaccine. Adults should be immunized if they wish to be protected from this disease, are under age 67 years and have diabetes, have chronic liver disease, have had more than one sex partner in the past 6 months, may be exposed to blood or other infectious body fluids, are household contacts or sex partners of hepatitis B positive people, are clients or workers in certain care facilities, or travel to or work in countries with a high rate of hepatitis B.  Haemophilus influenzae type b (Hib) vaccine. A previously unvaccinated person with asplenia or sickle cell disease or having a scheduled splenectomy should receive 1  dose of Hib vaccine. Regardless of previous immunization, a recipient of a hematopoietic stem cell transplant should receive a 3-dose series 6-12 months after his successful transplant. Hib vaccine is not recommended for adults with HIV infection. Preventive Service / Frequency Ages 13 to 59  Blood pressure check.** / Every 3-5 years.  Lipid and cholesterol check.** / Every 5 years beginning at age 39.  Hepatitis C blood test.** / For any individual with known risks for hepatitis C.  Skin self-exam. / Monthly.  Influenza vaccine. / Every year.  Tetanus, diphtheria, and acellular pertussis (Tdap, Td) vaccine.** / Consult your health care provider. 1 dose of Td every 10 years.  Varicella vaccine.** / Consult your health care provider.  HPV vaccine. / 3 doses over 6 months, if 26 or younger.  Measles, mumps, rubella (MMR) vaccine.** / You need at least 1 dose of MMR if you were born in 1957 or later. You may also need a second dose.  Pneumococcal 13-valent conjugate (PCV13) vaccine.** / Consult your health care provider.  Pneumococcal polysaccharide (PPSV23) vaccine.** / 1 to 2 doses if you smoke cigarettes or if you have certain conditions.  Meningococcal vaccine.** / 1 dose if you are age 63 to 64 years and a Orthoptist living in a residence hall, or have one of several medical conditions. You may also need additional booster doses.  Hepatitis A vaccine.** / Consult your health care provider.  Hepatitis B vaccine.** / Consult your health care provider.  Haemophilus influenzae type b (Hib) vaccine.** / Consult your health care provider. Ages 35 to 71  Blood pressure check.** / Every year.  Lipid and cholesterol check.** / Every 5 years beginning at age 11.  Lung cancer screening. / Every year if you are aged 55-80 years and have a 30-pack-year history of smoking and currently smoke or have quit within the past 15 years. Yearly screening is stopped once you have  quit smoking for at least 15 years or develop a health problem that would prevent you from having lung cancer treatment.  Fecal occult blood test (FOBT) of stool. / Every year beginning at  age 60 and continuing until age 62. You may not have to do this test if you get a colonoscopy every 10 years.  Flexible sigmoidoscopy** or colonoscopy.** / Every 5 years for a flexible sigmoidoscopy or every 10 years for a colonoscopy beginning at age 61 and continuing until age 8.  Hepatitis C blood test.** / For all people born from [birth year] through 1965 and any individual with known risks for hepatitis C.  Skin self-exam. / Monthly.  Influenza vaccine. / Every year.  Tetanus, diphtheria, and acellular pertussis (Tdap/Td) vaccine.** / Consult your health care provider. 1 dose of Td every 10 years.  Varicella vaccine.** / Consult your health care provider.  Zoster vaccine.** / 1 dose for adults aged 63 years or older.  Measles, mumps, rubella (MMR) vaccine.** / You need at least 1 dose of MMR if you were born in 1957 or later. You may also need a second dose.  Pneumococcal 13-valent conjugate (PCV13) vaccine.** / Consult your health care provider.  Pneumococcal polysaccharide (PPSV23) vaccine.** / 1 to 2 doses if you smoke cigarettes or if you have certain conditions.  Meningococcal vaccine.** / Consult your health care provider.  Hepatitis A vaccine.** / Consult your health care provider.  Hepatitis B vaccine.** / Consult your health care provider.  Haemophilus influenzae type b (Hib) vaccine.** / Consult your health care provider. Ages 52 and over  Blood pressure check.** / Every year.  Lipid and cholesterol check.**/ Every 5 years beginning at age 14.  Lung cancer screening. / Every year if you are aged 55-80 years and have a 30-pack-year history of smoking and currently smoke or have quit within the past 15 years. Yearly screening is stopped once you have quit smoking for at least 15 years or  develop a health problem that would prevent you from having lung cancer treatment.  Fecal occult blood test (FOBT) of stool. / Every year beginning at age 70 and continuing until age 27. You may not have to do this test if you get a colonoscopy every 10 years.  Flexible sigmoidoscopy** or colonoscopy.** / Every 5 years for a flexible sigmoidoscopy or every 10 years for a colonoscopy beginning at age 69 and continuing until age 60.  Hepatitis C blood test.** / For all people born from [birth year] through 1967 and any individual with known risks for hepatitis C.  Abdominal aortic aneurysm (AAA) screening.** / A one-time screening for ages 30 to 69 years who are current or former smokers.  Skin self-exam. / Monthly.  Influenza vaccine. / Every year.  Tetanus, diphtheria, and acellular pertussis (Tdap/Td) vaccine.** / 1 dose of Td every 10 years.  Varicella vaccine.** / Consult your health care provider.  Zoster vaccine.** / 1 dose for adults aged 71 years or older.  Pneumococcal 13-valent conjugate (PCV13) vaccine.** / 1 dose for all adults aged 19 years and older.  Pneumococcal polysaccharide (PPSV23) vaccine.** / 1 dose for all adults aged 61 years and older.  Meningococcal vaccine.** / Consult your health care provider.  Hepatitis A vaccine.** / Consult your health care provider.  Hepatitis B vaccine.** / Consult your health care provider.  Haemophilus influenzae type b (Hib) vaccine.** / Consult your health care provider. **Family history and personal history of risk and conditions may change your health care provider's recommendations.   This information is not intended to replace advice given to you by your health care provider. Make sure you discuss any questions you have with your health care provider.   Document  Released: 11/08/2001 Document Revised: 10/03/2014 Document Reviewed: 02/07/2011 Elsevier Interactive Patient Education [employer].

## 2015-12-09 ENCOUNTER — Encounter: Payer: Self-pay | Admitting: Family Medicine

## 2015-12-09 DIAGNOSIS — R011 Cardiac murmur, unspecified: Secondary | ICD-10-CM | POA: Insufficient documentation

## 2015-12-09 LAB — COMPREHENSIVE METABOLIC PANEL
ALBUMIN: 4.7 g/dL (ref 3.5–5.2)
ALT: 19 U/L (ref 0–53)
AST: 34 U/L (ref 0–37)
Alkaline Phosphatase: 63 U/L (ref 39–117)
BILIRUBIN TOTAL: 0.6 mg/dL (ref 0.2–1.2)
BUN: 18 mg/dL (ref 6–23)
CHLORIDE: 103 meq/L (ref 96–112)
CO2: 30 meq/L (ref 19–32)
CREATININE: 1.12 mg/dL (ref 0.40–1.50)
Calcium: 10 mg/dL (ref 8.4–10.5)
GFR: 69.48 mL/min (ref >60.00)
Glucose, Bld: 85 mg/dL (ref 70–99)
Potassium: 4.2 mEq/L (ref 3.5–5.1)
SODIUM: 140 meq/L (ref 135–145)
Total Protein: 7.4 g/dL (ref 6.0–8.3)

## 2015-12-09 LAB — CBC
HCT: 41.1 % (ref 39.0–52.0)
Hemoglobin: 13.9 g/dL (ref 13.0–17.0)
MCHC: 33.8 g/dL (ref 30.0–36.0)
MCV: 92.7 fl (ref 78.0–100.0)
Platelets: 238 10*3/uL (ref 150.0–400.0)
RBC: 4.44 Mil/uL (ref 4.22–5.81)
RDW: 14.4 % (ref 11.5–15.5)
WBC: 7.3 10*3/uL (ref 4.0–10.5)

## 2015-12-09 LAB — LIPID PANEL
CHOL/HDL RATIO: 3
Cholesterol: 265 mg/dL — ABNORMAL HIGH (ref 0–200)
HDL: 91.4 mg/dL (ref >39.00)
NonHDL: 173.73
Triglycerides: 262 mg/dL — ABNORMAL HIGH (ref 0.0–149.0)
VLDL: 52.4 mg/dL — AB (ref 0.0–40.0)

## 2015-12-09 LAB — LDL CHOLESTEROL, DIRECT: LDL DIRECT: 144 mg/dL

## 2015-12-09 LAB — TSH: TSH: 2.68 u[IU]/mL (ref 0.35–4.50)

## 2015-12-09 NOTE — Assessment & Plan Note (Signed)
Encouraged heart healthy diet, increase exercise, avoid trans fats, consider a krill oil cap daily. Patient hesitant to start meds.

## 2015-12-09 NOTE — Assessment & Plan Note (Signed)
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 His pain is intermittent along lower ribs, is b/l and occurs after strenuous exercise. Patient not interested in imaging at this time. Try topical treatments.

## 2015-12-09 NOTE — Assessment & Plan Note (Signed)
Check a 2D Echo to evaluate

## 2015-12-09 NOTE — Assessment & Plan Note (Signed)
Referred to dermatology for surveillance due to history, no new lesions today

## 2015-12-09 NOTE — Assessment & Plan Note (Signed)
Has noted increased incidence of food catching about 1/2 way down when swallowing. Is referred to gastroenterology for consideration

## 2015-12-11 ENCOUNTER — Encounter: Payer: Self-pay | Admitting: Family Medicine

## 2015-12-21 DIAGNOSIS — L570 Actinic keratosis: Secondary | ICD-10-CM | POA: Diagnosis not present

## 2015-12-21 DIAGNOSIS — Z85828 Personal history of other malignant neoplasm of skin: Secondary | ICD-10-CM | POA: Diagnosis not present

## 2015-12-21 DIAGNOSIS — D485 Neoplasm of uncertain behavior of skin: Secondary | ICD-10-CM | POA: Diagnosis not present

## 2015-12-21 DIAGNOSIS — Z23 Encounter for immunization: Secondary | ICD-10-CM | POA: Diagnosis not present

## 2015-12-22 DIAGNOSIS — C44712 Basal cell carcinoma of skin of right lower limb, including hip: Secondary | ICD-10-CM | POA: Diagnosis not present

## 2015-12-22 DIAGNOSIS — L821 Other seborrheic keratosis: Secondary | ICD-10-CM | POA: Diagnosis not present

## 2015-12-22 DIAGNOSIS — C44222 Squamous cell carcinoma of skin of right ear and external auricular canal: Secondary | ICD-10-CM | POA: Diagnosis not present

## 2015-12-23 DIAGNOSIS — M47816 Spondylosis without myelopathy or radiculopathy, lumbar region: Secondary | ICD-10-CM | POA: Diagnosis not present

## 2015-12-23 DIAGNOSIS — M791 Myalgia: Secondary | ICD-10-CM | POA: Diagnosis not present

## 2015-12-23 DIAGNOSIS — M9902 Segmental and somatic dysfunction of thoracic region: Secondary | ICD-10-CM | POA: Diagnosis not present

## 2015-12-23 DIAGNOSIS — M9903 Segmental and somatic dysfunction of lumbar region: Secondary | ICD-10-CM | POA: Diagnosis not present

## 2015-12-23 DIAGNOSIS — M9901 Segmental and somatic dysfunction of cervical region: Secondary | ICD-10-CM | POA: Diagnosis not present

## 2015-12-23 DIAGNOSIS — M47812 Spondylosis without myelopathy or radiculopathy, cervical region: Secondary | ICD-10-CM | POA: Diagnosis not present

## 2015-12-30 ENCOUNTER — Other Ambulatory Visit (HOSPITAL_BASED_OUTPATIENT_CLINIC_OR_DEPARTMENT_OTHER): Payer: PRIVATE HEALTH INSURANCE

## 2016-01-06 ENCOUNTER — Ambulatory Visit (HOSPITAL_BASED_OUTPATIENT_CLINIC_OR_DEPARTMENT_OTHER)
Admission: RE | Admit: 2016-01-06 | Discharge: 2016-01-06 | Disposition: A | Discharge status: Home / Self Care | Payer: Medicare Other | Source: Ambulatory Visit | Attending: Family Medicine | Admitting: Family Medicine

## 2016-01-06 DIAGNOSIS — I340 Nonrheumatic mitral (valve) insufficiency: Secondary | ICD-10-CM | POA: Insufficient documentation

## 2016-01-06 DIAGNOSIS — R011 Cardiac murmur, unspecified: Secondary | ICD-10-CM | POA: Diagnosis not present

## 2016-01-06 DIAGNOSIS — I341 Nonrheumatic mitral (valve) prolapse: Secondary | ICD-10-CM | POA: Insufficient documentation

## 2016-01-06 DIAGNOSIS — I351 Nonrheumatic aortic (valve) insufficiency: Secondary | ICD-10-CM | POA: Insufficient documentation

## 2016-01-06 NOTE — Progress Notes (Signed)
  Echocardiogram 2D Echocardiogram has been performed.  Austin Ramirez M 01/06/2016, 3:43 PM

## 2016-01-28 DIAGNOSIS — C44712 Basal cell carcinoma of skin of right lower limb, including hip: Secondary | ICD-10-CM | POA: Diagnosis not present

## 2016-01-28 DIAGNOSIS — C44222 Squamous cell carcinoma of skin of right ear and external auricular canal: Secondary | ICD-10-CM | POA: Diagnosis not present

## 2016-02-29 ENCOUNTER — Other Ambulatory Visit: Payer: Self-pay | Admitting: Family Medicine

## 2016-02-29 ENCOUNTER — Ambulatory Visit (HOSPITAL_BASED_OUTPATIENT_CLINIC_OR_DEPARTMENT_OTHER)
Admission: RE | Admit: 2016-02-29 | Discharge: 2016-02-29 | Disposition: A | Discharge status: Home / Self Care | Payer: Medicare Other | Source: Ambulatory Visit | Attending: Family Medicine | Admitting: Family Medicine

## 2016-02-29 DIAGNOSIS — M25511 Pain in right shoulder: Secondary | ICD-10-CM

## 2016-02-29 DIAGNOSIS — S4991XA Unspecified injury of right shoulder and upper arm, initial encounter: Secondary | ICD-10-CM | POA: Diagnosis not present

## 2016-03-16 ENCOUNTER — Other Ambulatory Visit (HOSPITAL_BASED_OUTPATIENT_CLINIC_OR_DEPARTMENT_OTHER): Payer: Self-pay | Admitting: Chiropractic Medicine

## 2016-03-16 ENCOUNTER — Ambulatory Visit (HOSPITAL_BASED_OUTPATIENT_CLINIC_OR_DEPARTMENT_OTHER)
Admission: RE | Admit: 2016-03-16 | Discharge: 2016-03-16 | Disposition: A | Discharge status: Home / Self Care | Payer: Medicare Other | Source: Ambulatory Visit | Attending: Chiropractic Medicine | Admitting: Chiropractic Medicine

## 2016-03-16 DIAGNOSIS — M47892 Other spondylosis, cervical region: Secondary | ICD-10-CM | POA: Insufficient documentation

## 2016-03-16 DIAGNOSIS — M9901 Segmental and somatic dysfunction of cervical region: Secondary | ICD-10-CM

## 2016-03-16 DIAGNOSIS — M47812 Spondylosis without myelopathy or radiculopathy, cervical region: Secondary | ICD-10-CM | POA: Diagnosis not present

## 2016-03-23 DIAGNOSIS — Q825 Congenital non-neoplastic nevus: Secondary | ICD-10-CM | POA: Diagnosis not present

## 2016-03-23 DIAGNOSIS — D239 Other benign neoplasm of skin, unspecified: Secondary | ICD-10-CM | POA: Diagnosis not present

## 2016-03-23 DIAGNOSIS — D485 Neoplasm of uncertain behavior of skin: Secondary | ICD-10-CM | POA: Diagnosis not present

## 2016-03-23 DIAGNOSIS — L821 Other seborrheic keratosis: Secondary | ICD-10-CM | POA: Diagnosis not present

## 2016-03-23 DIAGNOSIS — Z85828 Personal history of other malignant neoplasm of skin: Secondary | ICD-10-CM | POA: Diagnosis not present

## 2016-03-23 DIAGNOSIS — L814 Other melanin hyperpigmentation: Secondary | ICD-10-CM | POA: Diagnosis not present

## 2016-03-23 DIAGNOSIS — D1801 Hemangioma of skin and subcutaneous tissue: Secondary | ICD-10-CM | POA: Diagnosis not present

## 2016-03-23 DIAGNOSIS — D225 Melanocytic nevi of trunk: Secondary | ICD-10-CM | POA: Diagnosis not present

## 2016-03-25 DIAGNOSIS — C44519 Basal cell carcinoma of skin of other part of trunk: Secondary | ICD-10-CM | POA: Diagnosis not present

## 2016-04-21 DIAGNOSIS — C44519 Basal cell carcinoma of skin of other part of trunk: Secondary | ICD-10-CM | POA: Diagnosis not present

## 2016-06-09 ENCOUNTER — Ambulatory Visit: Payer: PRIVATE HEALTH INSURANCE | Admitting: Family Medicine

## 2016-08-11 ENCOUNTER — Ambulatory Visit: Payer: PRIVATE HEALTH INSURANCE | Admitting: Family Medicine

## 2016-09-07 DIAGNOSIS — L309 Dermatitis, unspecified: Secondary | ICD-10-CM | POA: Diagnosis not present

## 2016-09-07 DIAGNOSIS — Z23 Encounter for immunization: Secondary | ICD-10-CM | POA: Diagnosis not present

## 2016-09-07 DIAGNOSIS — D485 Neoplasm of uncertain behavior of skin: Secondary | ICD-10-CM | POA: Diagnosis not present

## 2016-09-08 DIAGNOSIS — C44229 Squamous cell carcinoma of skin of left ear and external auricular canal: Secondary | ICD-10-CM | POA: Diagnosis not present

## 2016-09-08 DIAGNOSIS — L819 Disorder of pigmentation, unspecified: Secondary | ICD-10-CM | POA: Diagnosis not present

## 2016-09-08 DIAGNOSIS — L905 Scar conditions and fibrosis of skin: Secondary | ICD-10-CM | POA: Diagnosis not present

## 2016-09-08 DIAGNOSIS — C44519 Basal cell carcinoma of skin of other part of trunk: Secondary | ICD-10-CM | POA: Diagnosis not present

## 2016-09-29 DIAGNOSIS — C44229 Squamous cell carcinoma of skin of left ear and external auricular canal: Secondary | ICD-10-CM | POA: Diagnosis not present

## 2016-09-29 DIAGNOSIS — L309 Dermatitis, unspecified: Secondary | ICD-10-CM | POA: Diagnosis not present

## 2016-10-03 DIAGNOSIS — L309 Dermatitis, unspecified: Secondary | ICD-10-CM | POA: Diagnosis not present

## 2016-10-11 ENCOUNTER — Ambulatory Visit (INDEPENDENT_AMBULATORY_CARE_PROVIDER_SITE_OTHER): Payer: Medicare Other | Admitting: Family Medicine

## 2016-10-11 ENCOUNTER — Encounter: Payer: Self-pay | Admitting: Family Medicine

## 2016-10-11 VITALS — BP 114/70 | HR 52 | Temp 98.4°F | Wt 163.2 lb

## 2016-10-11 DIAGNOSIS — T7840XA Allergy, unspecified, initial encounter: Secondary | ICD-10-CM | POA: Diagnosis not present

## 2016-10-11 DIAGNOSIS — L309 Dermatitis, unspecified: Secondary | ICD-10-CM | POA: Diagnosis not present

## 2016-10-11 DIAGNOSIS — E782 Mixed hyperlipidemia: Secondary | ICD-10-CM

## 2016-10-11 DIAGNOSIS — K219 Gastro-esophageal reflux disease without esophagitis: Secondary | ICD-10-CM | POA: Diagnosis not present

## 2016-10-11 HISTORY — DX: Dermatitis, unspecified: L30.9

## 2016-10-11 LAB — CBC WITH DIFFERENTIAL/PLATELET
BASOS PCT: 0.7 % (ref 0.0–3.0)
Basophils Absolute: 0 10*3/uL (ref 0.0–0.1)
Eosinophils Absolute: 0.3 10*3/uL (ref 0.0–0.7)
Eosinophils Relative: 4.1 % (ref 0.0–5.0)
HEMATOCRIT: 40.2 % (ref 39.0–52.0)
Hemoglobin: 13.6 g/dL (ref 13.0–17.0)
LYMPHS PCT: 28.3 % (ref 12.0–46.0)
Lymphs Abs: 1.9 10*3/uL (ref 0.7–4.0)
MCHC: 34 g/dL (ref 30.0–36.0)
MCV: 92.5 fl (ref 78.0–100.0)
Monocytes Absolute: 0.7 10*3/uL (ref 0.1–1.0)
Monocytes Relative: 10.2 % (ref 3.0–12.0)
NEUTROS ABS: 3.9 10*3/uL (ref 1.4–7.7)
Neutrophils Relative %: 56.7 % (ref 43.0–77.0)
PLATELETS: 239 10*3/uL (ref 150.0–400.0)
RBC: 4.34 Mil/uL (ref 4.22–5.81)
RDW: 14.3 % (ref 11.5–15.5)
WBC: 6.8 10*3/uL (ref 4.0–10.5)

## 2016-10-11 LAB — COMPREHENSIVE METABOLIC PANEL
ALBUMIN: 4.4 g/dL (ref 3.5–5.2)
ALK PHOS: 55 U/L (ref 39–117)
ALT: 15 U/L (ref 0–53)
AST: 25 U/L (ref 0–37)
BILIRUBIN TOTAL: 0.6 mg/dL (ref 0.2–1.2)
BUN: 16 mg/dL (ref 6–23)
CALCIUM: 9.4 mg/dL (ref 8.4–10.5)
CO2: 29 mEq/L (ref 19–32)
Chloride: 103 mEq/L (ref 96–112)
Creatinine, Ser: 1 mg/dL (ref 0.40–1.50)
GFR: 78.99 mL/min (ref >60.00)
Glucose, Bld: 67 mg/dL — ABNORMAL LOW (ref 70–99)
Potassium: 3.9 mEq/L (ref 3.5–5.1)
Sodium: 138 mEq/L (ref 135–145)
TOTAL PROTEIN: 7.1 g/dL (ref 6.0–8.3)

## 2016-10-11 LAB — LIPID PANEL
CHOLESTEROL: 257 mg/dL — AB (ref 0–200)
HDL: 87 mg/dL (ref >39.00)
LDL Cholesterol: 151 mg/dL — ABNORMAL HIGH (ref 0–99)
NonHDL: 170.49
TRIGLYCERIDES: 97 mg/dL (ref 0.0–149.0)
Total CHOL/HDL Ratio: 3
VLDL: 19.4 mg/dL (ref 0.0–40.0)

## 2016-10-11 LAB — TSH: TSH: 3.61 u[IU]/mL (ref 0.35–4.50)

## 2016-10-11 LAB — SEDIMENTATION RATE: Sed Rate: 4 mm/hr (ref 0–20)

## 2016-10-11 MED ORDER — RANITIDINE HCL 150 MG PO TABS: 150.0000 mg | ORAL_TABLET | Freq: Two times a day (BID) | ORAL | 3 refills | Status: DC

## 2016-10-11 MED ORDER — CETIRIZINE HCL 10 MG PO TABS: 10.0000 mg | ORAL_TABLET | Freq: Every day | ORAL | 3 refills | Status: DC

## 2016-10-11 NOTE — Progress Notes (Signed)
Patient ID: Austin Ramirez., male   DOB: [birth year], 66 y.o.   MRN: [id number]

## 2016-10-11 NOTE — Progress Notes (Signed)
Patient ID: Austin Ramirez., male   DOB: 02-Apr-1951, 66 y.o.   MRN: [id number]

## 2016-10-11 NOTE — Progress Notes (Signed)
Subjective:    Patient ID: Austin Ferron., male    DOB: 1951/08/25, 66 y.o.   MRN: [id number]  Chief Complaint  Patient presents with  . Rash    HPI Patient is in today for a rash. 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 Would like to have a full panel of lab work done. No additional concerns noted. Has used antihistamines with temporary symptom relief then rash recurs. Denies CP/palp/SOB/HA/congestion/fevers/GI or GU c/o. Taking meds as prescribed  Past Medical History:  Diagnosis Date  . Allergic state 12/08/2015  . Allergy   . BCC (basal cell carcinoma of skin) 12/08/2015  . Cancer ([place])    skin cancer  . Dermatitis 10/11/2016  . Dysphagia 12/08/2015  . Esophageal reflux 12/08/2015  . Frequent headaches   . GERD (gastroesophageal reflux disease)   . Heart murmur   . Hyperlipidemia   . Hyperlipidemia, mixed 12/08/2015  . Low back pain 12/08/2015  . Preventative health care 12/08/2015  . Rib pain 12/08/2015    History reviewed. No pertinent surgical history.  Family History  Problem Relation Age of Onset  . Arthritis Mother   . Heart disease Mother   . Arthritis Father   . Heart disease Father   . Hypertension Father     Social History   Social History  . Marital status: Married    Spouse name: N/A  . Number of children: N/A  . Years of education: N/A   Occupational History  . Retired    Social History Main Topics  . Smoking status: Never Smoker  . Smokeless tobacco: Never Used  . Alcohol use 0.0 oz/week  . Drug use: No  . Sexual activity: Not on file     Comment: lives with wife, ultra marathon runner, retired from [employer] work,    Other Topics Concern  . Not on file   Social History Narrative  . No narrative on file    Outpatient Medications Prior to Visit  Medication Sig Dispense Refill  . aspirin 81 MG tablet Three times a week    . DHEA 10  MG CAPS Take by mouth daily.    Marland Kitchen glucosamine-chondroitin 500-400 MG tablet Take 1 tablet by mouth daily.    . Multiple Vitamin (MULTIVITAMIN) tablet Take 1 tablet by mouth daily.    . multivitamin-lutein (OCUVITE-LUTEIN) CAPS capsule Take 1 capsule by mouth daily.    . Omega-3 Fatty Acids (FISH OIL PO) Take by mouth 3 x daily with food.    Marland Kitchen b complex vitamins capsule Take 1 capsule by mouth daily.     No facility-administered medications prior to visit.     Allergies  Allergen Reactions  . Almond Meal Swelling    Review of Systems  Constitutional: Negative for fever and malaise/fatigue.  HENT: Negative for congestion.   Eyes: Negative for blurred vision.  Respiratory: Negative for cough and shortness of breath.   Cardiovascular: Negative for chest pain, palpitations and leg swelling.  Gastrointestinal: Negative for vomiting.  Musculoskeletal: Negative for back pain.  Skin: Positive for itching and rash.       All over his body.  Neurological: Negative for loss of consciousness and headaches.       Objective:    Physical Exam  Constitutional: He is oriented to person, place, and time. He appears well-developed and well-nourished. No distress.  HENT:  Head: Normocephalic and atraumatic.  Eyes: Conjunctivae are normal.  Neck: Normal range of motion. No thyromegaly present.  Cardiovascular: Normal rate and regular rhythm.   Pulmonary/Chest: Effort normal and breath sounds normal. He has no wheezes.  Abdominal: Soft. Bowel sounds are normal. There is no tenderness.  Musculoskeletal: He exhibits no edema or deformity.  Neurological: He is alert and oriented to person, place, and time.  Skin: Skin is warm and dry. Rash noted. He is not diaphoretic.  Scattered macules and maculpapular lesions throughout.  Psychiatric: He has a normal mood and affect.    BP 114/70 (BP Location: Left Arm, Patient Position: Sitting, Cuff Size: Large)   Pulse (!) 52   Temp 98.4 F (36.9 C)  (Oral)   Wt 163 lb 3.2 oz (74 kg)   SpO2 96% Comment: RA  BMI 24.81 kg/m  Wt Readings from Last 3 Encounters:  10/11/16 163 lb 3.2 oz (74 kg)  12/08/15 162 lb 4 oz (73.6 kg)     Lab Results  Component Value Date   WBC 6.8 10/11/2016   HGB 13.6 10/11/2016   HCT 40.2 10/11/2016   PLT 239.0 10/11/2016   GLUCOSE 67 (L) 10/11/2016   CHOL 257 (H) 10/11/2016   TRIG 97.0 10/11/2016   HDL 87.00 10/11/2016   LDLDIRECT 144.0 12/08/2015   LDLCALC 151 (H) 10/11/2016   ALT 15 10/11/2016   AST 25 10/11/2016   NA 138 10/11/2016   K 3.9 10/11/2016   CL 103 10/11/2016   CREATININE 1.00 10/11/2016   BUN 16 10/11/2016   CO2 29 10/11/2016   TSH 3.61 10/11/2016    Lab Results  Component Value Date   TSH 3.61 10/11/2016   Lab Results  Component Value Date   WBC 6.8 10/11/2016   HGB 13.6 10/11/2016   HCT 40.2 10/11/2016   MCV 92.5 10/11/2016   PLT 239.0 10/11/2016   Lab Results  Component Value Date   NA 138 10/11/2016   K 3.9 10/11/2016   CO2 29 10/11/2016   GLUCOSE 67 (L) 10/11/2016   BUN 16 10/11/2016   CREATININE 1.00 10/11/2016   BILITOT 0.6 10/11/2016   ALKPHOS 55 10/11/2016   AST 25 10/11/2016   ALT 15 10/11/2016   PROT 7.1 10/11/2016   ALBUMIN 4.4 10/11/2016   CALCIUM 9.4 10/11/2016   GFR 78.99 10/11/2016   Lab Results  Component Value Date   CHOL 257 (H) 10/11/2016   Lab Results  Component Value Date   HDL 87.00 10/11/2016   Lab Results  Component Value Date   LDLCALC 151 (H) 10/11/2016   Lab Results  Component Value Date   TRIG 97.0 10/11/2016   Lab Results  Component Value Date   CHOLHDL 3 10/11/2016   No results found for: HGBA1C     Assessment & Plan:   Problem List Items Addressed This Visit    Esophageal reflux - Primary   Relevant Medications   ranitidine (ZANTAC) 150 MG tablet   Other Relevant Orders   Sedimentation rate (Completed)   Comprehensive metabolic panel (Completed)   TSH (Completed)   CBC with Differential/Platelet  (Completed)   Hyperlipidemia, mixed   Relevant Orders   Sedimentation rate (Completed)   Comprehensive metabolic panel (Completed)   Lipid panel (Completed)   TSH (Completed)   CBC with Differential/Platelet (Completed)   Allergic state   Dermatitis    Labs unremarkable, etiology of rash unclear but appears to be allergic try Zantac and Zyrtec bid if no improvement for further work up.  Relevant Orders   Sedimentation rate (Completed)   Comprehensive metabolic panel (Completed)   TSH (Completed)   CBC with Differential/Platelet (Completed)      I am having Mr. Cravens start on cetirizine and ranitidine. I am also having him maintain his multivitamin, Omega-3 Fatty Acids (FISH OIL PO), glucosamine-chondroitin, multivitamin-lutein, aspirin, DHEA, b complex vitamins, and triamcinolone ointment.  Meds ordered this encounter  Medications  . triamcinolone ointment (KENALOG) 0.1 %    Refill:  0  . cetirizine (ZYRTEC) 10 MG tablet    Sig: Take 1 tablet (10 mg total) by mouth daily.    Dispense:  60 tablet    Refill:  3  . ranitidine (ZANTAC) 150 MG tablet    Sig: Take 1 tablet (150 mg total) by mouth 2 (two) times daily.    Dispense:  60 tablet    Refill:  3    CMA served as scribe during this visit. History, Physical and Plan performed by medical provider. Documentation and orders reviewed and attested to.  Penni Homans, MD

## 2016-10-11 NOTE — Progress Notes (Signed)
Pre visit review using our clinic review tool, if applicable. No additional management support is needed unless otherwise documented below in the visit note. 

## 2016-10-11 NOTE — Patient Instructions (Addendum)
Encourage Patient to take Zyrtec and Zantac twice daily for 2 months Atopic Dermatitis Atopic dermatitis is a skin disorder that causes inflammation of the skin. This is the most common type of eczema. Eczema is a group of skin conditions that cause the skin to be itchy, red, and swollen. This condition is generally worse during the cooler winter months and often improves during the warm summer months. Symptoms can vary from person to person. Atopic dermatitis usually starts showing signs in infancy and can last through adulthood. This condition cannot be passed from one person to another (non-contagious), but is more common in families. Atopic dermatitis may not always be present. When it is present, it is called a flare-up. What are the causes? The exact cause of this condition is not known. Flare-ups of the condition may be triggered by:  Contact with something you are sensitive or allergic to.  Stress.  Certain foods.  Extremely hot or cold weather.  Harsh chemicals and soaps.  Dry air.  Chlorine. What increases the risk? This condition is more likely to develop in people who have a personal history or family history of eczema, allergies, asthma, or hay fever. What are the signs or symptoms? Symptoms of this condition include:  Dry, scaly skin.  Red, itchy rash.  Itchiness, which can be severe. This may occur before the skin rash. This can make sleeping difficult.  Skin thickening and cracking can occur over time. How is this diagnosed? This condition is diagnosed based on your symptoms, a medical history, and a physical exam. How is this treated? There is no cure for this condition, but symptoms can usually be controlled. Treatment focuses on:  Controlling the itching and scratching. You may be given medicines, such as antihistamines or steroid creams.  Limiting exposure to things that you are sensitive or allergic to (allergens).  Recognizing situations that cause  stress and developing a plan to manage stress. If your atopic dermatitis does not get better with medicines or is all over your body (widespread) , a treatment using a specific type of light (phototherapy) may be used. Follow these instructions at home: Skin care  Keep your skin well-moisturized. This seals in moisture and help prevent dryness.  Use unscented lotions that have petroleum in them.  Avoid lotions that contain alcohol and water. They can dry the skin.  Keep baths or showers short (less than 5 minutes) in warm water. Do not use hot water.  Use mild, unscented cleansers for bathing. Avoid soap and bubble bath.  Apply a moisturizer to your skin right after a bath or shower.   Do not apply anything to your skin without checking with your health care provider. General instructions  Dress in clothes made of cotton or cotton blends. Dress lightly because heat increases itching.  When washing your clothes, rinse your clothes twice so all of the soap is removed.  Avoid any triggers that can cause a flare-up.  Try to manage your stress.  Keep your fingernails cut short.  Avoid scratching. Scratching makes the rash and itching worse. It may also result in a skin infection (impetigo) due to a break in the skin caused by scratching.  Take or apply over-the-counter and prescription medicines only as told by your health care provider.  Keep all follow-up visits as told by your health care provider. This is important.  Do not be around people who have cold sores or fever blisters. If you get the infection, it may cause your atopic  dermatitis to worsen. Contact a health care provider if:  Your itching interferes with sleep.  Your rash gets worse or is not better within one week of starting treatment.  You have a fever.  You have a rash flare-up after having contact with someone who has cold sores or fever blisters. Get help right away if:  You develop pus or soft yellow  scabs in the rash area. Summary  This condition causes a red rash and itchy, dry, scaly skin.  Treatment focuses on controlling the itching and scratching, limiting exposure to things that you are sensitive or allergic to (allergens), and recognizing situations that cause stress and developing a plan to manage stress.  Keep your skin well-moisturized.  Keep baths or showers less than 5 minutes. This information is not intended to replace advice given to you by your health care provider. Make sure you discuss any questions you have with your health care provider. Document Released: 09/09/2000 Document Revised: 02/18/2016 Document Reviewed: 04/15/2013 Elsevier Interactive Patient Education  2017 [employer].

## 2016-10-16 NOTE — Assessment & Plan Note (Signed)
Labs unremarkable, etiology of rash unclear but appears to be allergic try Zantac and Zyrtec bid if no improvement for further work up.

## 2016-11-14 ENCOUNTER — Encounter: Payer: Self-pay | Admitting: Family Medicine

## 2016-11-15 ENCOUNTER — Other Ambulatory Visit: Payer: Self-pay | Admitting: Family Medicine

## 2016-11-15 MED ORDER — FLUCONAZOLE 150 MG PO TABS: ORAL_TABLET | ORAL | 0 refills | Status: DC

## 2016-12-05 DIAGNOSIS — L300 Nummular dermatitis: Secondary | ICD-10-CM | POA: Diagnosis not present

## 2017-01-06 ENCOUNTER — Telehealth: Payer: Self-pay

## 2017-01-06 MED ORDER — AMOXICILLIN-POT CLAVULANATE ER 1000-62.5 MG PO TB12: 1.0000 | ORAL_TABLET | Freq: Two times a day (BID) | ORAL | 0 refills | Status: DC

## 2017-01-06 NOTE — Telephone Encounter (Signed)
Sent in antibiotic and the wife has been informed of all instructions. She did verbalize understanding/ The patients symptoms have been--Headache, head pressure/soreness, draining and smelly mucous.

## 2017-01-06 NOTE — Telephone Encounter (Signed)
Patient's wife states he has really bad sinus infection. Wants an antibiotic to be called. Patient's mom is actively dying and it will be hard to come in the office. Patient's wife stated they will come if they need to and will see anyone available if they have to.  Please advise   Call back number: [phone] Anselm Jungling (wife)

## 2017-01-06 NOTE — Telephone Encounter (Signed)
I am willing to prescribe him a course of antibiotics but if he does not improve he needs to come in. Just have them tell you his symptoms. Augmentin XR 1 tab po bid x 7d then also Encouraged increased rest and hydration, add probiotics, zinc such as Coldeze or Xicam. Treat fevers as needed. Add Vitamin C 1000 mg daily, elderberry liquid several times a day. Aged garlic daily and plain mucinex those things will all help his immune system keep up during all of this stress

## 2017-01-16 ENCOUNTER — Other Ambulatory Visit: Payer: Self-pay

## 2017-01-16 MED ORDER — CLOBETASOL PROPIONATE 0.05 % EX CREA: TOPICAL_CREAM | CUTANEOUS | 1 refills | Status: DC

## 2017-01-25 ENCOUNTER — Telehealth: Payer: PRIVATE HEALTH INSURANCE | Admitting: Family

## 2017-01-25 ENCOUNTER — Encounter: Payer: Self-pay | Admitting: Family Medicine

## 2017-01-25 DIAGNOSIS — R05 Cough: Secondary | ICD-10-CM

## 2017-01-25 DIAGNOSIS — R112 Nausea with vomiting, unspecified: Secondary | ICD-10-CM

## 2017-01-25 DIAGNOSIS — R059 Cough, unspecified: Secondary | ICD-10-CM

## 2017-01-25 MED ORDER — ONDANSETRON HCL 4 MG PO TABS: 4.0000 mg | ORAL_TABLET | Freq: Three times a day (TID) | ORAL | 0 refills | Status: DC | PRN

## 2017-01-25 MED ORDER — BENZONATATE 100 MG PO CAPS: 100.0000 mg | ORAL_CAPSULE | Freq: Three times a day (TID) | ORAL | 0 refills | Status: DC | PRN

## 2017-01-25 NOTE — Progress Notes (Signed)
We are sorry that you are not feeling well.  Here is how we plan to help!  Based on what you have shared with me it looks like you have upper respiratory tract inflammation that has resulted in a significant cough.  Inflammation and infection in the upper respiratory tract is commonly called bronchitis and has four common causes:  Allergies, Viral Infections, Acid Reflux and Bacterial Infections.  Allergies, viruses and acid reflux are treated by controlling symptoms or eliminating the cause. An example might be a cough caused by taking certain blood pressure medications. You stop the cough by changing the medication. Another example might be a cough caused by acid reflux. Controlling the reflux helps control the cough.  Based on your presentation I believe you most likely have A cough due to a virus.  This is called viral bronchitis and is best treated by rest, plenty of fluids and control of the cough.  You may use Ibuprofen or Tylenol as directed to help your symptoms.     In addition you may use A non-prescription cough medication called Robitussin DAC. Take 2 teaspoons every 8 hours or Delsym: take 2 teaspoons every 12 hours., A non-prescription cough medication called Mucinex DM: take 2 tablets every 12 hours. and A prescription cough medication called Tessalon Perles 100mg. You may take 1-2 capsules every 8 hours as needed for your cough.   USE OF BRONCHODILATOR ("RESCUE") INHALERS: There is a risk from using your bronchodilator too frequently.  The risk is that over-reliance on a medication which only relaxes the muscles surrounding the breathing tubes can reduce the effectiveness of medications prescribed to reduce swelling and congestion of the tubes themselves.  Although you feel brief relief from the bronchodilator inhaler, your asthma may actually be worsening with the tubes becoming more swollen and filled with mucus.  This can delay other crucial treatments, such as oral steroid medications.  If you need to use a bronchodilator inhaler daily, several times per day, you should discuss this with your provider.  There are probably better treatments that could be used to keep your asthma under control.     HOME CARE . Only take medications as instructed by your medical team. . Complete the entire course of an antibiotic. . Drink plenty of fluids and get plenty of rest. . Avoid close contacts especially the very young and the elderly . Cover your mouth if you cough or cough into your sleeve. . Always remember to wash your hands . A steam or ultrasonic humidifier can help congestion.   GET HELP RIGHT AWAY IF: . You develop worsening fever. . You become short of breath . You cough up blood. . Your symptoms persist after you have completed your treatment plan MAKE SURE YOU   Understand these instructions.  Will watch your condition.  Will get help right away if you are not doing well or get worse.  Your e-visit answers were reviewed by a board certified advanced clinical practitioner to complete your personal care plan.  Depending on the condition, your plan could have included both over the counter or prescription medications. If there is a problem please reply  once you have received a response from your provider. Your safety is important to us.  If you have drug allergies check your prescription carefully.    You can use MyChart to ask questions about today's visit, request a non-urgent call back, or ask for a work or school excuse for 24 hours related to this e-Visit.   If it has been greater than 24 hours you will need to follow up with your provider, or enter a new e-Visit to address those concerns. You will get an e-mail in the next two days asking about your experience.  I hope that your e-visit has been valuable and will speed your recovery. Thank you for using e-visits.   

## 2017-01-25 NOTE — Addendum Note (Signed)
Addended by: Evelina Dun A on: 01/25/2017 08:21 PM   Modules accepted: Orders

## 2017-01-27 ENCOUNTER — Telehealth: Payer: Self-pay | Admitting: Behavioral Health

## 2017-01-27 NOTE — Telephone Encounter (Signed)
[facility] note received via fax  Call Date: 01/25/17 Time: 5:30 PM    Caller: Cecile Hearing (patient's spouse) Return number: [phone]  Nurse: Danise Mina, RN  Chief Complaint: Abdominal Pain  Reason for call: Caller states husband has nausea, diarrhea, headache, muscle pain, abdominal pain & temperature 99.4. History of pancreatitis; started this morning.  Guideline: Diarrhea  Disposition: See physician within 4 hours (or PCP triage)   Per chart, the patient had an E-Visit for the above symptoms.

## 2017-02-16 DIAGNOSIS — D225 Melanocytic nevi of trunk: Secondary | ICD-10-CM | POA: Diagnosis not present

## 2017-02-16 DIAGNOSIS — L2084 Intrinsic (allergic) eczema: Secondary | ICD-10-CM | POA: Diagnosis not present

## 2017-02-16 DIAGNOSIS — L570 Actinic keratosis: Secondary | ICD-10-CM | POA: Diagnosis not present

## 2017-02-16 DIAGNOSIS — L910 Hypertrophic scar: Secondary | ICD-10-CM | POA: Diagnosis not present

## 2017-02-16 DIAGNOSIS — L821 Other seborrheic keratosis: Secondary | ICD-10-CM | POA: Diagnosis not present

## 2017-02-16 DIAGNOSIS — Z85828 Personal history of other malignant neoplasm of skin: Secondary | ICD-10-CM | POA: Diagnosis not present

## 2017-04-11 ENCOUNTER — Encounter: Payer: Self-pay | Admitting: Family Medicine

## 2017-04-11 ENCOUNTER — Ambulatory Visit (INDEPENDENT_AMBULATORY_CARE_PROVIDER_SITE_OTHER): Payer: Medicare Other | Admitting: Family Medicine

## 2017-04-11 ENCOUNTER — Ambulatory Visit (HOSPITAL_BASED_OUTPATIENT_CLINIC_OR_DEPARTMENT_OTHER)
Admission: RE | Admit: 2017-04-11 | Discharge: 2017-04-11 | Disposition: A | Discharge status: Home / Self Care | Payer: Medicare Other | Source: Ambulatory Visit | Attending: Family Medicine | Admitting: Family Medicine

## 2017-04-11 VITALS — BP 120/70 | HR 66 | Temp 97.9°F | Resp 18 | Wt 165.8 lb

## 2017-04-11 DIAGNOSIS — M160 Bilateral primary osteoarthritis of hip: Secondary | ICD-10-CM | POA: Diagnosis not present

## 2017-04-11 DIAGNOSIS — M545 Low back pain: Secondary | ICD-10-CM | POA: Diagnosis not present

## 2017-04-11 DIAGNOSIS — R351 Nocturia: Secondary | ICD-10-CM

## 2017-04-11 DIAGNOSIS — R0781 Pleurodynia: Secondary | ICD-10-CM

## 2017-04-11 DIAGNOSIS — E782 Mixed hyperlipidemia: Secondary | ICD-10-CM

## 2017-04-11 DIAGNOSIS — Z125 Encounter for screening for malignant neoplasm of prostate: Secondary | ICD-10-CM

## 2017-04-11 DIAGNOSIS — C44712 Basal cell carcinoma of skin of right lower limb, including hip: Secondary | ICD-10-CM | POA: Diagnosis not present

## 2017-04-11 DIAGNOSIS — Z8719 Personal history of other diseases of the digestive system: Secondary | ICD-10-CM

## 2017-04-11 DIAGNOSIS — K219 Gastro-esophageal reflux disease without esophagitis: Secondary | ICD-10-CM | POA: Diagnosis not present

## 2017-04-11 DIAGNOSIS — M533 Sacrococcygeal disorders, not elsewhere classified: Secondary | ICD-10-CM | POA: Diagnosis not present

## 2017-04-11 DIAGNOSIS — M1611 Unilateral primary osteoarthritis, right hip: Secondary | ICD-10-CM | POA: Diagnosis not present

## 2017-04-11 DIAGNOSIS — Z0000 Encounter for general adult medical examination without abnormal findings: Secondary | ICD-10-CM | POA: Diagnosis not present

## 2017-04-11 DIAGNOSIS — M25551 Pain in right hip: Secondary | ICD-10-CM | POA: Diagnosis not present

## 2017-04-11 HISTORY — DX: Nocturia: R35.1

## 2017-04-11 HISTORY — DX: Sacrococcygeal disorders, not elsewhere classified: M53.3

## 2017-04-11 LAB — PSA: PSA: 1.35 ng/mL (ref 0.10–4.00)

## 2017-04-11 LAB — TSH: TSH: 4.27 u[IU]/mL (ref 0.35–4.50)

## 2017-04-11 LAB — LIPID PANEL
CHOLESTEROL: 263 mg/dL — AB (ref 0–200)
HDL: 97.8 mg/dL (ref >39.00)
LDL Cholesterol: 147 mg/dL — ABNORMAL HIGH (ref 0–99)
NonHDL: 164.9
Total CHOL/HDL Ratio: 3
Triglycerides: 92 mg/dL (ref 0.0–149.0)
VLDL: 18.4 mg/dL (ref 0.0–40.0)

## 2017-04-11 LAB — COMPREHENSIVE METABOLIC PANEL
ALT: 20 U/L (ref 0–53)
AST: 33 U/L (ref 0–37)
Albumin: 4.4 g/dL (ref 3.5–5.2)
Alkaline Phosphatase: 62 U/L (ref 39–117)
BILIRUBIN TOTAL: 0.6 mg/dL (ref 0.2–1.2)
BUN: 17 mg/dL (ref 6–23)
CO2: 27 mEq/L (ref 19–32)
CREATININE: 1.05 mg/dL (ref 0.40–1.50)
Calcium: 9.6 mg/dL (ref 8.4–10.5)
Chloride: 105 mEq/L (ref 96–112)
GFR: 74.55 mL/min (ref >60.00)
Glucose, Bld: 71 mg/dL (ref 70–99)
POTASSIUM: 4.5 meq/L (ref 3.5–5.1)
SODIUM: 140 meq/L (ref 135–145)
TOTAL PROTEIN: 6.6 g/dL (ref 6.0–8.3)

## 2017-04-11 LAB — CBC
HEMATOCRIT: 40 % (ref 39.0–52.0)
Hemoglobin: 13.4 g/dL (ref 13.0–17.0)
MCHC: 33.6 g/dL (ref 30.0–36.0)
MCV: 93.3 fl (ref 78.0–100.0)
Platelets: 216 10*3/uL (ref 150.0–400.0)
RBC: 4.29 Mil/uL (ref 4.22–5.81)
RDW: 15.2 % (ref 11.5–15.5)
WBC: 5.1 10*3/uL (ref 4.0–10.5)

## 2017-04-11 MED ORDER — CLOBETASOL PROPIONATE 0.05 % EX CREA: TOPICAL_CREAM | CUTANEOUS | 1 refills | Status: DC

## 2017-04-11 NOTE — Assessment & Plan Note (Signed)
Check PSA today, he has had several friends die from prostate cancer.

## 2017-04-11 NOTE — Assessment & Plan Note (Signed)
Patient encouraged to maintain heart healthy diet, regular exercise, adequate sleep. Consider daily probiotics. Take medications as prescribed 

## 2017-04-11 NOTE — Assessment & Plan Note (Signed)
Encouraged moist heat and gentle stretching as tolerated. May try NSAIDs and prescription meds as directed and report if symptoms worsen or seek immediate care 

## 2017-04-11 NOTE — Patient Instructions (Signed)
Tylenol and Advil twice daily, Lidocaine patch or gel, menthol or aspirin gel Preventive Care 68 Years and Older, Male Preventive care refers to lifestyle choices and visits with your health care provider that can promote health and wellness. What does preventive care include?  A yearly physical exam. This is also called an annual well check.  Dental exams once or twice a year.  Routine eye exams. Ask your health care provider how often you should have your eyes checked.  Personal lifestyle choices, including: ? Daily care of your teeth and gums. ? Regular physical activity. ? Eating a healthy diet. ? Avoiding tobacco and drug use. ? Limiting alcohol use. ? Practicing safe sex. ? Taking low doses of aspirin every day. ? Taking vitamin and mineral supplements as recommended by your health care provider. What happens during an annual well check? The services and screenings done by your health care provider during your annual well check will depend on your age, overall health, lifestyle risk factors, and family history of disease. Counseling Your health care provider may ask you questions about your:  Alcohol use.  Tobacco use.  Drug use.  Emotional well-being.  Home and relationship well-being.  Sexual activity.  Eating habits.  History of falls.  Memory and ability to understand (cognition).  Work and work Astronomer.  Screening You may have the following tests or measurements:  Height, weight, and BMI.  Blood pressure.  Lipid and cholesterol levels. These may be checked every 5 years, or more frequently if you are over 68 years old.  Skin check.  Lung cancer screening. You may have this screening every year starting at age 68 if you have a 30-pack-year history of smoking and currently smoke or have quit within the past 15 years.  Fecal occult blood test (FOBT) of the stool. You may have this test every year starting at age 68.  Flexible sigmoidoscopy or  colonoscopy. You may have a sigmoidoscopy every 5 years or a colonoscopy every 10 years starting at age 68.  Prostate cancer screening. Recommendations will vary depending on your family history and other risks.  Hepatitis C blood test.  Hepatitis B blood test.  Sexually transmitted disease (STD) testing.  Diabetes screening. This is done by checking your blood sugar (glucose) after you have not eaten for a while (fasting). You may have this done every 1-3 years.  Abdominal aortic aneurysm (AAA) screening. You may need this if you are a current or former smoker.  Osteoporosis. You may be screened starting at age 68 if you are at high risk.  Talk with your health care provider about your test results, treatment options, and if necessary, the need for more tests. Vaccines Your health care provider may recommend certain vaccines, such as:  Influenza vaccine. This is recommended every year.  Tetanus, diphtheria, and acellular pertussis (Tdap, Td) vaccine. You may need a Td booster every 10 years.  Varicella vaccine. You may need this if you have not been vaccinated.  Zoster vaccine. You may need this after age 68.  Measles, mumps, and rubella (MMR) vaccine. You may need at least one dose of MMR if you were born in 1957 or later. You may also need a second dose.  Pneumococcal 13-valent conjugate (PCV13) vaccine. One dose is recommended after age 68.  Pneumococcal polysaccharide (PPSV23) vaccine. One dose is recommended after age 68.  Meningococcal vaccine. You may need this if you have certain conditions.  Hepatitis A vaccine. You may need this if you  if you have certain conditions or if you travel or work in places where you may be exposed to hepatitis A.  Hepatitis B vaccine. You may need this if you have certain conditions or if you travel or work in places where you may be exposed to hepatitis B.  Haemophilus influenzae type b (Hib) vaccine. You may need this if you have certain risk  factors. Talk to your health care provider about which screenings and vaccines you need and how often you need them. This information is not intended to replace advice given to you by your health care provider. Make sure you discuss any questions you have with your health care provider. Document Released: 10/09/2015 Document Revised: 06/01/2016 Document Reviewed: 07/14/2015 Elsevier Interactive Patient Education  2017 [employer]  

## 2017-04-11 NOTE — Assessment & Plan Note (Signed)
Encouraged heart healthy diet, increase exercise, avoid trans fats, consider a krill oil cap daily 

## 2017-04-11 NOTE — Assessment & Plan Note (Signed)
Avoid offending foods, start probiotics. Do not eat large meals in late evening and consider raising head of bed.  

## 2017-04-11 NOTE — Assessment & Plan Note (Signed)
Tylenol and Advil twice daily, Lidocaine patch or gel, menthol or aspirin gel, continue chiropractic adjustments

## 2017-04-11 NOTE — Progress Notes (Signed)
Subjective:  I acted as a Education administrator for Dr. Charlett Blake. [place], [place]  Patient ID: Austin Ramirez., male    DOB: 1951-06-20, 66 y.o.   MRN: [id number]  No chief complaint on file.   HPI  Patient is in today for an annual exam. He feels well today. No recent febrile illness or hospitalizations. Is worried about several friends being diagnosed with prostate cancer recently. He notes some nocturia. No dysuria. He has posterior right hip pain worse when he first starts running then loosens up and stops hurting as he runs. His friends had central low back pain. Does well with ADLs and eats a heart healthy diet and runs marathons. Is preparing to move back to [place] after the death of both of his parents. Denies CP/palp/SOB/HA/congestion/fevers/GI or GU c/o. Taking meds as prescribed  Patient Care Team: Mosie Lukes, MD as PCP - General (Family Medicine)   Past Medical History:  Diagnosis Date  . Allergic state 12/08/2015  . Allergy   . BCC (basal cell carcinoma of skin) 12/08/2015  . Cancer ([place])    skin cancer  . Dermatitis 10/11/2016  . Dysphagia 12/08/2015  . Esophageal reflux 12/08/2015  . Frequent headaches   . GERD (gastroesophageal reflux disease)   . Heart murmur   . Hyperlipidemia   . Hyperlipidemia, mixed 12/08/2015  . Low back pain 12/08/2015  . Nocturia 04/11/2017  . Preventative health care 12/08/2015  . Rib pain 12/08/2015  . 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 No pertinent surgical history.  Family History  Problem Relation Age of Onset  . Arthritis Mother   . Heart disease Mother   . Arthritis Father   . Heart disease Father   . Hypertension Father     Social History   Social History  . Marital status: Married    Spouse name: N/A  . Number of children: N/A  . Years of education: N/A   Occupational History  . Retired    Social History Main Topics  . Smoking status: Never Smoker  . Smokeless tobacco: Never Used  . Alcohol use 0.0  oz/week  . Drug use: No  . Sexual activity: Yes     Comment: lives with wife, ultra marathon runner, retired from [employer] work,    Other Topics Concern  . Not on file   Social History Narrative  . No narrative on file    Outpatient Medications Prior to Visit  Medication Sig Dispense Refill  . aspirin 81 MG tablet Three times a week    . b complex vitamins capsule Take 1 capsule by mouth daily.    . cetirizine (ZYRTEC) 10 MG tablet Take 1 tablet (10 mg total) by mouth daily. 60 tablet 3  . DHEA 10 MG CAPS Take by mouth daily.    Marland Kitchen glucosamine-chondroitin 500-400 MG tablet Take 1 tablet by mouth daily.    . Multiple Vitamin (MULTIVITAMIN) tablet Take 1 tablet by mouth daily.    . multivitamin-lutein (OCUVITE-LUTEIN) CAPS capsule Take 1 capsule by mouth daily.    . Omega-3 Fatty Acids (FISH OIL PO) Take by mouth 3 x daily with food.    . triamcinolone ointment (KENALOG) 0.1 %   0  . amoxicillin-clavulanate (AUGMENTIN XR) 1000-62.5 MG 12 hr tablet Take 1 tablet by mouth 2 (two) times daily. 14 tablet 0  . benzonatate (TESSALON PERLES) 100 MG capsule Take 1 capsule (100 mg total) by mouth 3 (three) times daily as needed. 20 capsule 0  .  clobetasol cream (TEMOVATE) 0.05 % Apply to effected area qd prn. 60 g 1  . fluconazole (DIFLUCAN) 150 MG tablet 1 tablet po qd x 3 days then 1 tab po q week x 3 weeks 6 tablet 0  . ondansetron (ZOFRAN) 4 MG tablet Take 1 tablet (4 mg total) by mouth every 8 (eight) hours as needed for nausea or vomiting. 20 tablet 0  . ranitidine (ZANTAC) 150 MG tablet Take 1 tablet (150 mg total) by mouth 2 (two) times daily. 60 tablet 3   No facility-administered medications prior to visit.     Allergies  Allergen Reactions  . Almond Meal Swelling    Review of Systems  Constitutional: Negative for fever and malaise/fatigue.  HENT: Negative for congestion.   Eyes: Negative for blurred vision.  Respiratory: Negative for cough and shortness of breath.     Cardiovascular: Negative for chest pain, palpitations and leg swelling.  Gastrointestinal: Negative for vomiting.  Genitourinary: Positive for frequency. Negative for dysuria and urgency.  Musculoskeletal: Positive for back pain and joint pain.  Skin: Negative for rash.  Neurological: Negative for loss of consciousness and headaches.       Objective:    Physical Exam  Constitutional: He is oriented to person, place, and time. He appears well-developed and well-nourished. No distress.  HENT:  Head: Normocephalic and atraumatic.  Eyes: Conjunctivae are normal.  Neck: Normal range of motion. No thyromegaly present.  Cardiovascular: Normal rate and regular rhythm.   Pulmonary/Chest: Effort normal and breath sounds normal. He has no wheezes.  Abdominal: Soft. Bowel sounds are normal. There is no tenderness.  Musculoskeletal: Normal range of motion. He exhibits no edema or deformity.  Right hip rotated lower and anterior to left hip  Neurological: He is alert and oriented to person, place, and time.  Skin: Skin is warm and dry. He is not diaphoretic.  Psychiatric: He has a normal mood and affect.    BP 120/70 (BP Location: Left Arm, Patient Position: Sitting, Cuff Size: Normal)   Pulse 66   Temp 97.9 F (36.6 C) (Oral)   Resp 18   Wt 165 lb 12.8 oz (75.2 kg)   SpO2 99%   BMI 25.21 kg/m  Wt Readings from Last 3 Encounters:  04/11/17 165 lb 12.8 oz (75.2 kg)  10/11/16 163 lb 3.2 oz (74 kg)  12/08/15 162 lb 4 oz (73.6 kg)   BP Readings from Last 3 Encounters:  04/11/17 120/70  10/11/16 114/70  12/08/15 132/80      There is no immunization history on file for this patient.  Health Maintenance  Topic Date Due  . Hepatitis C Screening  1951-09-22  . TETANUS/TDAP  10/23/1967  . COLONOSCOPY  10/22/1998  . PNA vac Low Risk Adult (1 of 2 - PCV13) 10/22/2013  . INFLUENZA VACCINE  04/26/2017    Lab Results  Component Value Date   WBC 6.8 10/11/2016   HGB 13.6 10/11/2016    HCT 40.2 10/11/2016   PLT 239.0 10/11/2016   GLUCOSE 67 (L) 10/11/2016   CHOL 257 (H) 10/11/2016   TRIG 97.0 10/11/2016   HDL 87.00 10/11/2016   LDLDIRECT 144.0 12/08/2015   LDLCALC 151 (H) 10/11/2016   ALT 15 10/11/2016   AST 25 10/11/2016   NA 138 10/11/2016   K 3.9 10/11/2016   CL 103 10/11/2016   CREATININE 1.00 10/11/2016   BUN 16 10/11/2016   CO2 29 10/11/2016   TSH 3.61 10/11/2016    Lab Results  Component Value Date   TSH 3.61 10/11/2016   Lab Results  Component Value Date   WBC 6.8 10/11/2016   HGB 13.6 10/11/2016   HCT 40.2 10/11/2016   MCV 92.5 10/11/2016   PLT 239.0 10/11/2016   Lab Results  Component Value Date   NA 138 10/11/2016   K 3.9 10/11/2016   CO2 29 10/11/2016   GLUCOSE 67 (L) 10/11/2016   BUN 16 10/11/2016   CREATININE 1.00 10/11/2016   BILITOT 0.6 10/11/2016   ALKPHOS 55 10/11/2016   AST 25 10/11/2016   ALT 15 10/11/2016   PROT 7.1 10/11/2016   ALBUMIN 4.4 10/11/2016   CALCIUM 9.4 10/11/2016   GFR 78.99 10/11/2016   Lab Results  Component Value Date   CHOL 257 (H) 10/11/2016   Lab Results  Component Value Date   HDL 87.00 10/11/2016   Lab Results  Component Value Date   LDLCALC 151 (H) 10/11/2016   Lab Results  Component Value Date   TRIG 97.0 10/11/2016   Lab Results  Component Value Date   CHOLHDL 3 10/11/2016   No results found for: HGBA1C       Assessment & Plan:   Problem List Items Addressed This Visit    RESOLVED: Rib pain   Relevant Orders   TSH   PSA   Preventative health care    Patient encouraged to maintain heart healthy diet, regular exercise, adequate sleep. Consider daily probiotics. Take medications as prescribed      Relevant Orders   CBC   Comprehensive metabolic panel   Lipid panel   TSH   PSA   Low back pain    Encouraged moist heat and gentle stretching as tolerated. May try NSAIDs and prescription meds as directed and report if symptoms worsen or seek immediate care       Relevant Orders   TSH   PSA   History of gallstones   Relevant Orders   TSH   Esophageal reflux    Avoid offending foods, start probiotics. Do not eat large meals in late evening and consider raising head of bed.       Relevant Orders   CBC   TSH   Hyperlipidemia, mixed    Encouraged heart healthy diet, increase exercise, avoid trans fats, consider a krill oil cap daily      Relevant Orders   Lipid panel   TSH   Nocturia    Check PSA today, he has had several friends die from prostate cancer.      Relevant Orders   PSA   Sacroiliac dysfunction    Try Tylenol ES and Advil 200 mg twice daily, add a LIdocaine patch to hip, doing chiropractic      Relevant Orders   PSA   Right hip pain - Primary    Tylenol and Advil twice daily, Lidocaine patch or gel, menthol or aspirin gel, continue chiropractic adjustments       Relevant Orders   DG HIP UNILAT WITH PELVIS 2-3 VIEWS RIGHT (Completed)    Other Visit Diagnoses    Prostate cancer screening       Relevant Orders   PSA      I have discontinued Mr. Ackert ranitidine, fluconazole, amoxicillin-clavulanate, benzonatate, and ondansetron. I am also having him maintain his multivitamin, Omega-3 Fatty Acids (FISH OIL PO), glucosamine-chondroitin, multivitamin-lutein, aspirin, DHEA, b complex vitamins, triamcinolone ointment, cetirizine, and clobetasol cream.  Meds ordered this encounter  Medications  . clobetasol cream (TEMOVATE) 0.05 %  Sig: Apply to effected area qd prn.    Dispense:  60 g    Refill:  1    CMA served as scribe during this visit. History, Physical and Plan performed by medical provider. Documentation and orders reviewed and attested to.  Penni Homans, MD

## 2017-04-11 NOTE — Assessment & Plan Note (Addendum)
Try Tylenol ES and Advil 200 mg twice daily, add a LIdocaine patch to hip, doing chiropractic

## 2017-04-25 ENCOUNTER — Encounter: Payer: Self-pay | Admitting: Family Medicine

## 2017-04-25 NOTE — Progress Notes (Signed)
[facility]   Skin, right posterior leg  Superficial basal cell carcinoma with focal infiltration, pigmented.

## 2017-05-19 DIAGNOSIS — Z85828 Personal history of other malignant neoplasm of skin: Secondary | ICD-10-CM | POA: Diagnosis not present

## 2017-05-19 DIAGNOSIS — L570 Actinic keratosis: Secondary | ICD-10-CM | POA: Diagnosis not present

## 2017-05-19 DIAGNOSIS — L905 Scar conditions and fibrosis of skin: Secondary | ICD-10-CM | POA: Diagnosis not present

## 2017-07-17 ENCOUNTER — Telehealth: Payer: Self-pay | Admitting: Family Medicine

## 2017-07-17 NOTE — Telephone Encounter (Signed)
Called pt to schedule AWV. Lvm for pt to call office to schedule appt.   *Pt has never received AWV; appt can be scheduled at anytime. SF

## 2017-09-12 DIAGNOSIS — Z08 Encounter for follow-up examination after completed treatment for malignant neoplasm: Secondary | ICD-10-CM | POA: Diagnosis not present

## 2017-09-12 DIAGNOSIS — D485 Neoplasm of uncertain behavior of skin: Secondary | ICD-10-CM | POA: Diagnosis not present

## 2017-09-12 DIAGNOSIS — L309 Dermatitis, unspecified: Secondary | ICD-10-CM | POA: Diagnosis not present

## 2017-09-12 DIAGNOSIS — L570 Actinic keratosis: Secondary | ICD-10-CM | POA: Diagnosis not present

## 2017-09-12 DIAGNOSIS — D2272 Melanocytic nevi of left lower limb, including hip: Secondary | ICD-10-CM | POA: Diagnosis not present

## 2017-09-12 DIAGNOSIS — C44612 Basal cell carcinoma of skin of right upper limb, including shoulder: Secondary | ICD-10-CM | POA: Diagnosis not present

## 2017-09-12 DIAGNOSIS — L905 Scar conditions and fibrosis of skin: Secondary | ICD-10-CM | POA: Diagnosis not present

## 2017-09-12 DIAGNOSIS — Z85828 Personal history of other malignant neoplasm of skin: Secondary | ICD-10-CM | POA: Diagnosis not present

## 2017-09-12 DIAGNOSIS — D692 Other nonthrombocytopenic purpura: Secondary | ICD-10-CM | POA: Diagnosis not present

## 2017-09-12 DIAGNOSIS — L821 Other seborrheic keratosis: Secondary | ICD-10-CM | POA: Diagnosis not present

## 2017-09-12 DIAGNOSIS — Q825 Congenital non-neoplastic nevus: Secondary | ICD-10-CM | POA: Diagnosis not present

## 2018-01-18 IMAGING — DX DG SHOULDER 2+V*R*
3 series · 4 of 4 positions shown · non-contrast
Comparison: None.

CLINICAL DATA: Right shoulder pain. Injury playing basketball 5
weeks ago.

EXAM:
RIGHT SHOULDER - 2+ VIEW

[shoulder grashey]
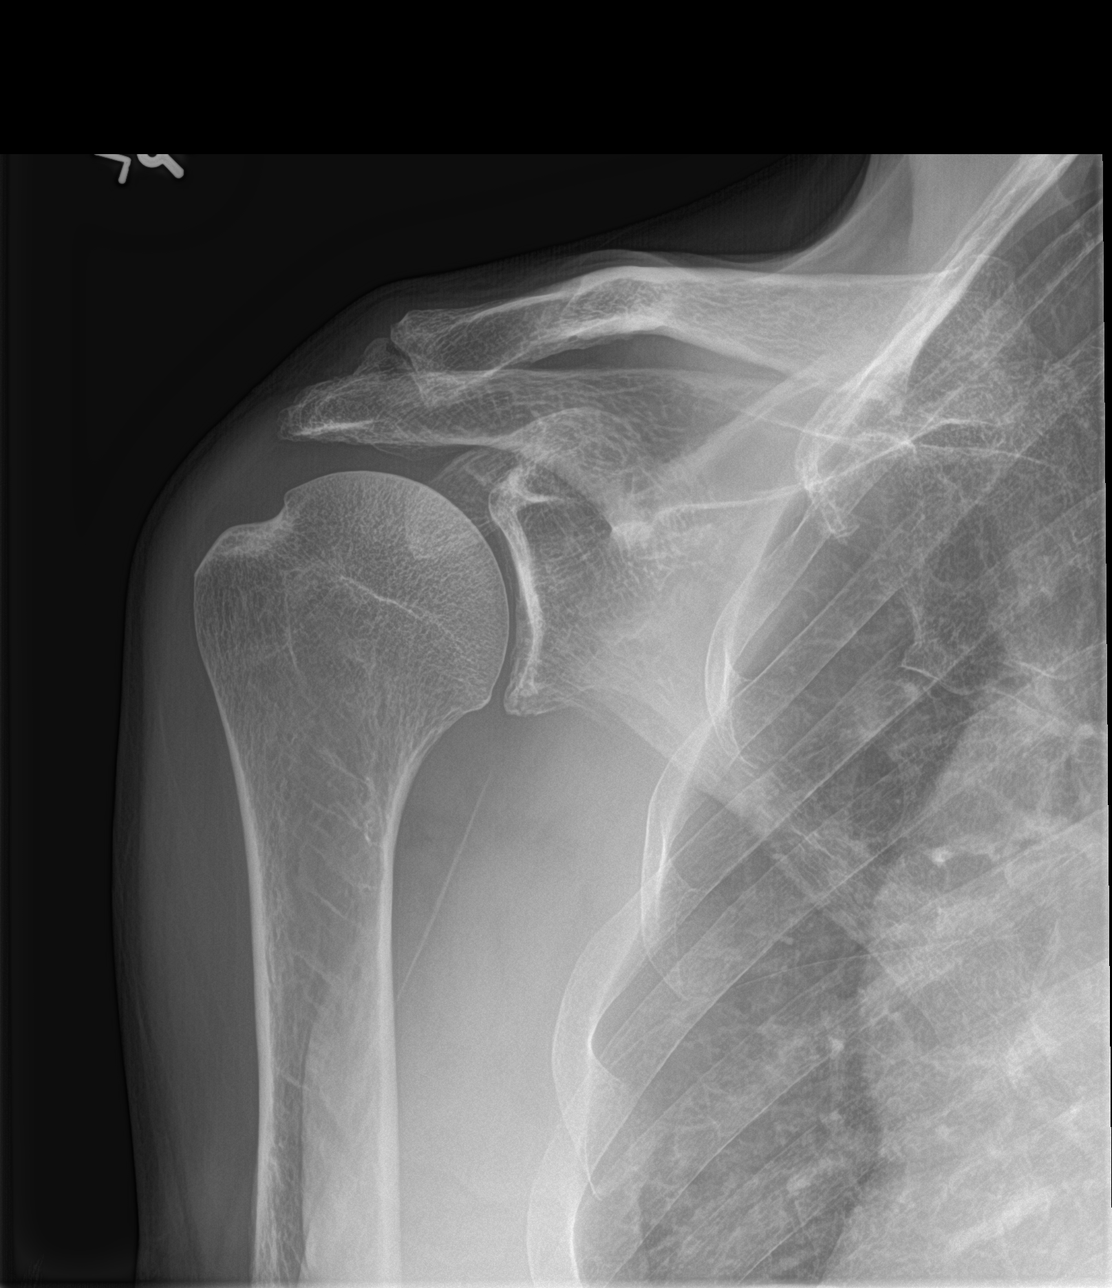

[Series 2: shoulder y view · 0.14mm/px · 2 of 2 slices shown]
[im 1/2]
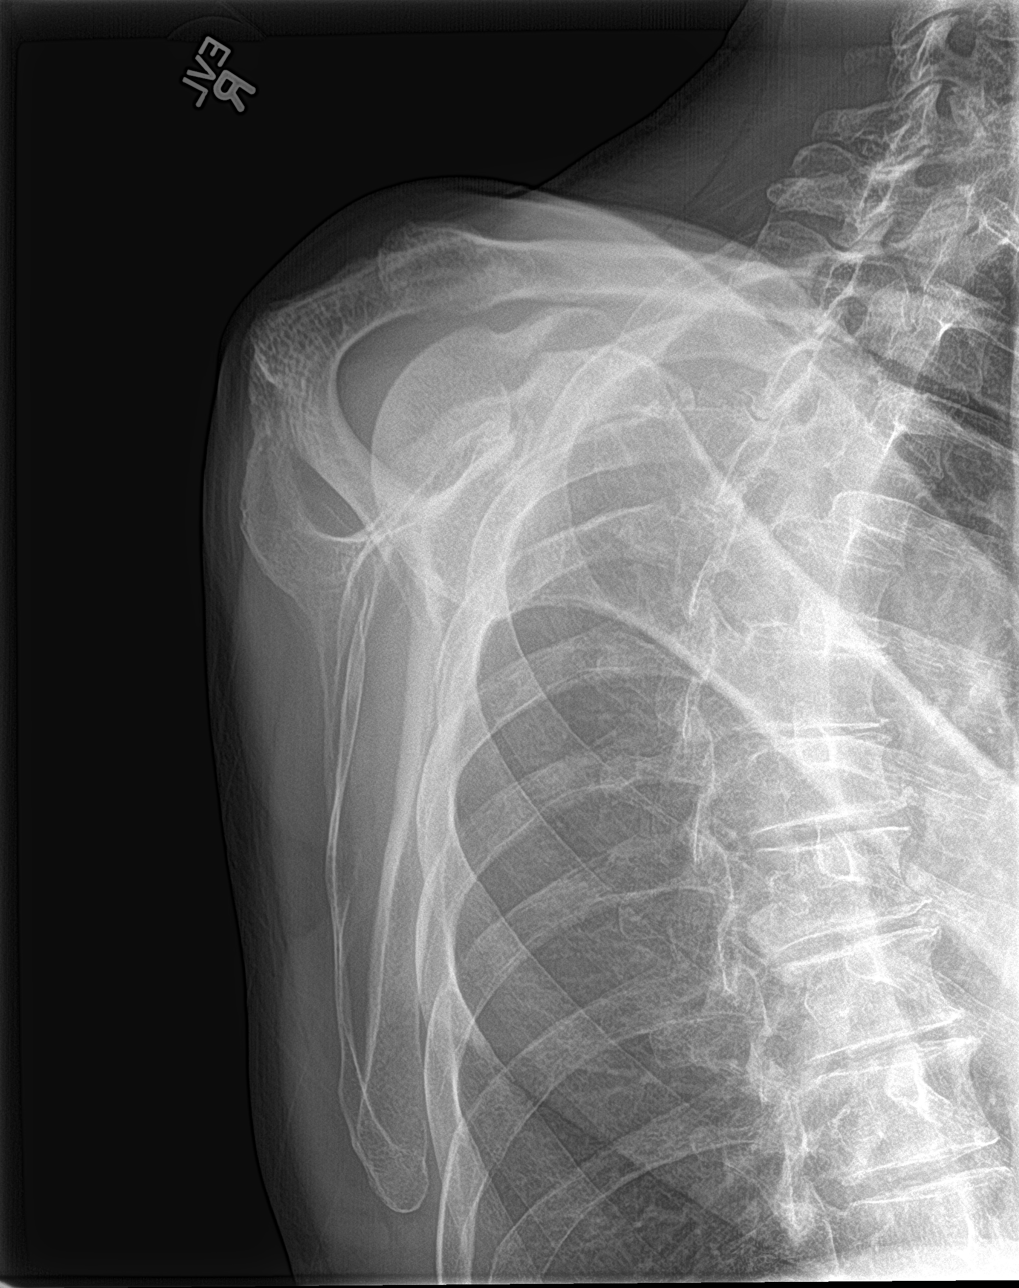
[im 2/2]
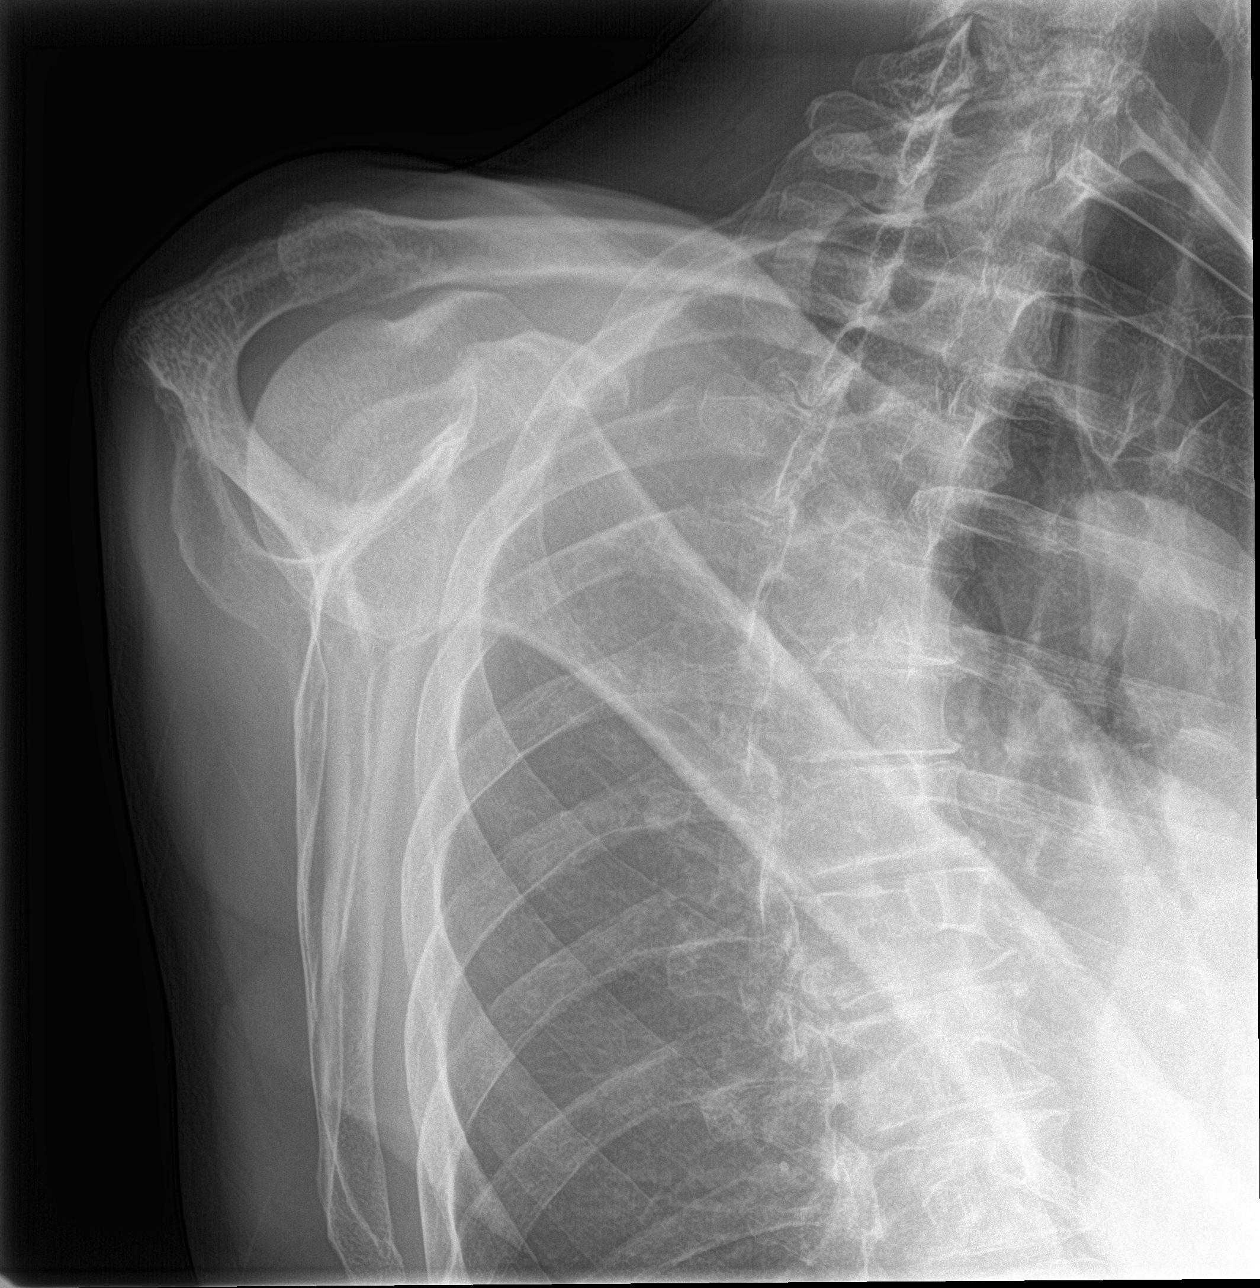

[shoulder axillary]
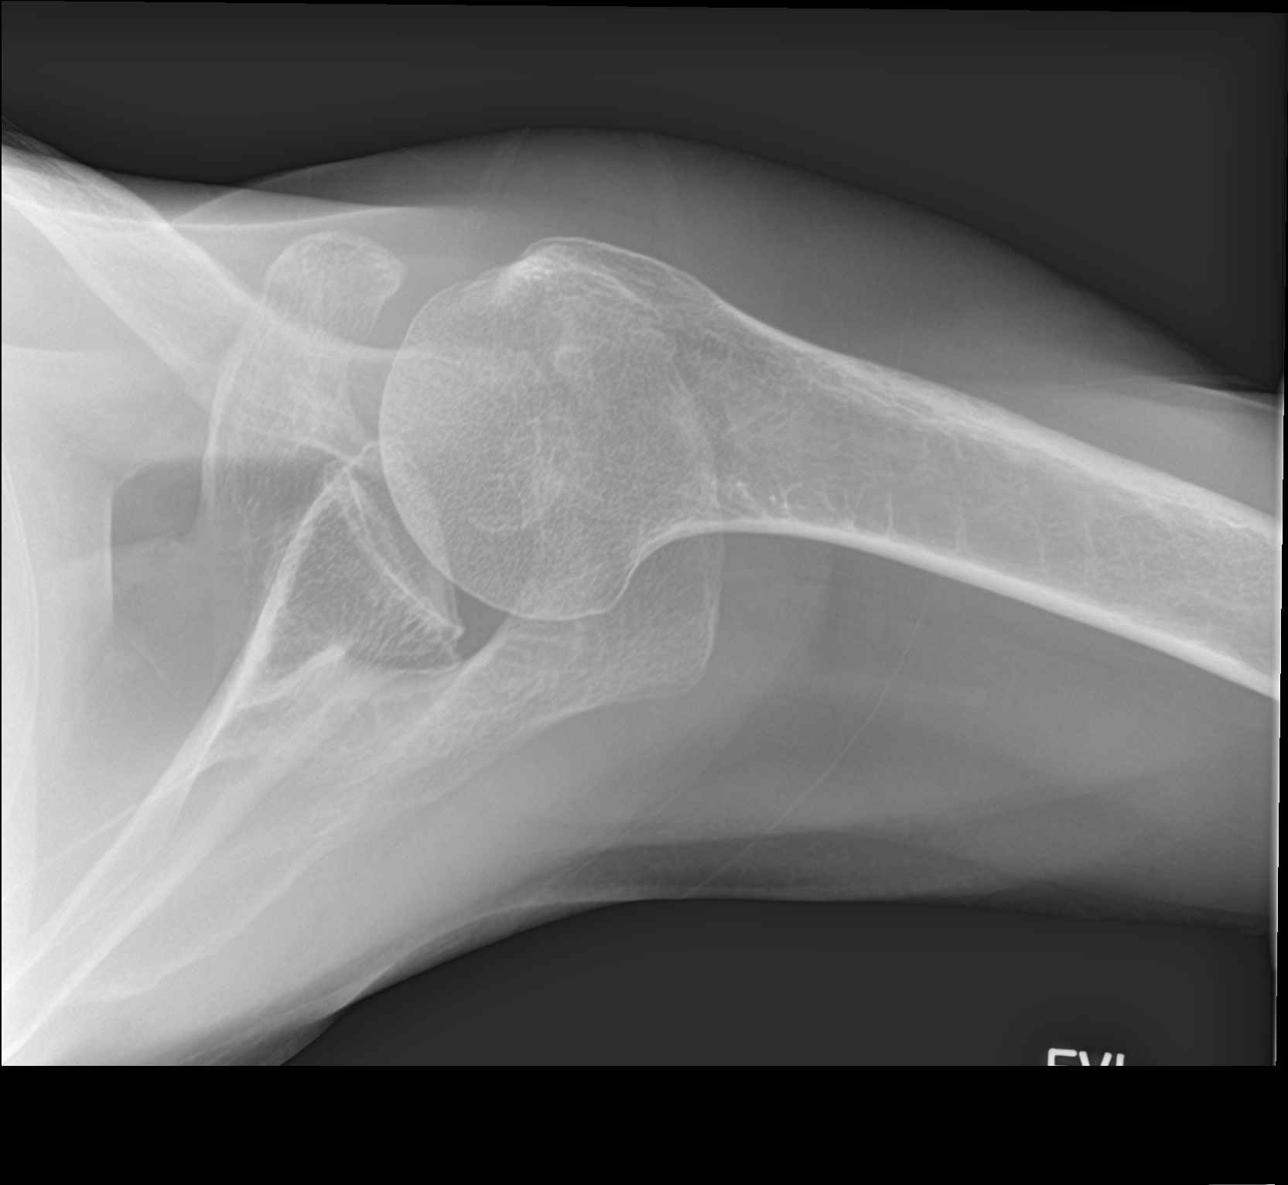

[4 of 4 positions shown; findings below may reference images not displayed]

FINDINGS: Mild degenerative changes in the right AC joint. Glenohumeral joint
is maintained. No acute bony abnormality. Specifically, no fracture,
subluxation, or dislocation. Soft tissues are intact.
IMPRESSION: No acute bony abnormality.

## 2021-07-13 ENCOUNTER — Ambulatory Visit (INDEPENDENT_AMBULATORY_CARE_PROVIDER_SITE_OTHER): Payer: Medicare Other | Admitting: Family Medicine

## 2021-07-13 ENCOUNTER — Encounter: Payer: Self-pay | Admitting: Family Medicine

## 2021-07-13 ENCOUNTER — Other Ambulatory Visit: Payer: Self-pay

## 2021-07-13 VITALS — BP 120/82 | HR 58 | Temp 98.6°F | Resp 16 | Ht 67.0 in | Wt 148.0 lb

## 2021-07-13 DIAGNOSIS — E782 Mixed hyperlipidemia: Secondary | ICD-10-CM

## 2021-07-13 DIAGNOSIS — Z0000 Encounter for general adult medical examination without abnormal findings: Secondary | ICD-10-CM | POA: Diagnosis not present

## 2021-07-13 DIAGNOSIS — M545 Low back pain, unspecified: Secondary | ICD-10-CM | POA: Diagnosis not present

## 2021-07-13 DIAGNOSIS — Z8673 Personal history of transient ischemic attack (TIA), and cerebral infarction without residual deficits: Secondary | ICD-10-CM

## 2021-07-13 NOTE — Progress Notes (Signed)
Patient ID: Austin Pidcock., male    DOB: 09/06/1951  Age: 70 y.o. MRN: [id number]    Subjective:   Chief Complaint  Patient presents with   Annual Exam   Subjective  HPI Austin Ramirez. presents for office visit today for a new patient visit and comprehensive physical exam today. He reports that his recent PSA's were normal. 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 He has had 30 days of rehab before release. Denies CP/palp/SOB/HA/congestion/fevers/GI or GU c/o. Taking meds as prescribed.   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 He states that he has to wear half size shoe bigger due to swelling in right foot. The swelling also contributing to him developing a bunion on the medial side and a pressure sore on the lateral side of right foot. At the moment, he walks almost everyday. He reports that the ROM of his right hand has improved, but still not at its best. 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   Review of Systems  Constitutional:  Negative for chills, fatigue and fever.  HENT:  Negative for congestion, rhinorrhea, sinus pressure, sinus pain, sore throat and trouble swallowing.   Eyes:  Negative for pain.  Respiratory:  Negative for cough and shortness of breath.   Cardiovascular:  Negative for chest pain, palpitations and leg swelling.  Gastrointestinal:  Negative for abdominal pain, blood in stool, diarrhea, nausea and vomiting.  Genitourinary:  Negative for flank pain, frequency and penile pain.  Musculoskeletal:  Positive for arthralgias. Negative for back pain.  Neurological:  Negative for headaches.   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   His family history includes Arthritis in his father and mother; Heart disease in his father and mother; Hypertension in his father.He reports that he has never smoked. He has never used smokeless tobacco. He reports current alcohol use. He reports that he does not use drugs.  Current Outpatient Medications on File Prior to Visit  Medication Sig Dispense Refill   b complex vitamins capsule Take 1 capsule by mouth daily.     clobetasol cream (TEMOVATE) 0.05 % Apply to effected area qd prn. 60 g 1   DHEA 10 MG CAPS Take by mouth daily.     Multiple Vitamin (MULTIVITAMIN) tablet Take 1 tablet by mouth daily.     multivitamin-lutein (OCUVITE-LUTEIN) CAPS capsule Take 1 capsule by mouth daily.     Omega-3 Fatty Acids (FISH OIL PO) Take by mouth 3 x daily with food.     No current facility-administered medications on file prior to visit.     Objective:  Objective  Physical Exam Constitutional:      General: He is not in acute distress.    Appearance: Normal  appearance. He is not ill-appearing or toxic-appearing.  HENT:     Head: Normocephalic and atraumatic.     Right Ear: Tympanic membrane, ear canal and external ear normal.     Left Ear: Tympanic membrane, ear canal and external ear normal.     Nose: No congestion or rhinorrhea.  Eyes:     Extraocular Movements: Extraocular movements intact.     Pupils: Pupils are equal, round, and reactive to light.  Cardiovascular:     Rate and Rhythm: Normal rate and regular rhythm.     Pulses: Normal pulses.     Heart sounds: Normal heart sounds. No murmur heard. Pulmonary:     Effort: Pulmonary effort is normal.  No respiratory distress.     Breath sounds: Normal breath sounds. No wheezing, rhonchi or rales.  Abdominal:     General: Bowel sounds are normal.     Palpations: Abdomen is soft. There is no mass.     Tenderness: There is no abdominal tenderness. There is no guarding.     Hernia: No hernia is present.  Musculoskeletal:        General: Normal range of motion.     Cervical back: Normal range of motion and neck supple.  Skin:    General: Skin is warm and dry.  Neurological:     Mental Status: He is alert and oriented to person, place, and time.  Psychiatric:        Behavior: Behavior normal.   BP 120/82   Pulse (!) 58   Temp 98.6 F (37 C)   Resp 16   Ht 5\' 7"  (1.702 m)   Wt 148 lb (67.1 kg)   SpO2 97%   BMI 23.18 kg/m  Wt Readings from Last 3 Encounters:  07/13/21 148 lb (67.1 kg)  04/11/17 165 lb 12.8 oz (75.2 kg)  10/11/16 163 lb 3.2 oz (74 kg)     Lab Results  Component Value Date   WBC 6.3 07/13/2021   HGB 13.4 07/13/2021   HCT 40.4 07/13/2021   PLT 239.0 07/13/2021   GLUCOSE 84 07/13/2021   CHOL 265 (H) 07/13/2021   TRIG 113.0 07/13/2021   HDL 92.30 07/13/2021   LDLDIRECT 144.0 12/08/2015   LDLCALC 150 (H) 07/13/2021   ALT 18 07/13/2021   AST 29 07/13/2021   NA 139 07/13/2021   K 4.1 07/13/2021   CL 102 07/13/2021   CREATININE 1.11 07/13/2021   BUN 18 07/13/2021   CO2 27 07/13/2021   TSH 3.84 07/13/2021   PSA 1.08 07/13/2021    DG HIP UNILAT WITH PELVIS 2-3 VIEWS RIGHT  Result Date: 04/11/2017 CLINICAL DATA:  Right posterior hip pain EXAM: DG HIP (WITH OR WITHOUT PELVIS) 2-3V RIGHT COMPARISON:  None. FINDINGS: No acute fracture or dislocation. Mild osteoarthritis of bilateral hips. No aggressive lytic or sclerotic osseous lesion. IMPRESSION: Mild osteoarthritis of bilateral hips. Electronically Signed   By: Kathreen Devoid   On: 04/11/2017 11:22     Assessment & Plan:  Plan    No orders of the defined types were placed in this  encounter.   Problem List Items Addressed This Visit     Preventative health care    Patient had previously been a patient of our practice but moved to [place] for several years. He has not been seen here since 2018 so is here today to reestablish as a new patient and for annual check up. We will request records from his care providers in [place]. Patient  encouraged to maintain heart healthy diet, regular exercise, adequate sleep. Consider daily probiotics. Take medications as prescribed.      Relevant Orders   CBC with Differential/Platelet (Completed)   Comprehensive metabolic panel (Completed)   TSH (Completed)   PSA (Completed)   Lipid panel (Completed)   Low back pain    Encouraged moist heat and gentle stretching as tolerated. May try NSAIDs and prescription meds as directed and report if symptoms worsen or seek immediate care      Hyperlipidemia, mixed - Primary    Encourage heart healthy diet such as MIND or DASH diet, increase exercise, avoid trans fats, simple carbohydrates and processed foods, consider a krill or fish or flaxseed oil cap daily.       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 They were never able to find a specific cause and he has improved tremendously. Int he beginning he was paralyzed on the right side but he has worked hard and has good use of his arm and leg now. 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 Will continue to monitor progress and help him manage risk factors for recurrent events.        Follow-up: Return in about 6 months (around 01/11/2022) for f/u visit.  I, Suezanne Jacquet, acting as a scribe for Penni Homans, MD, have documented all relevent documentation on behalf  of Penni Homans, MD, as directed by Penni Homans, MD while in the presence of Penni Homans, MD. DO:07/14/21.  I, Mosie Lukes, MD personally performed the services described in this documentation. All medical record entries made by the scribe were at my direction and in my presence. I have reviewed the chart and agree that the record reflects my personal performance and is accurate and complete

## 2021-07-13 NOTE — Patient Instructions (Addendum)
New bivalent booster available downstairs at the [facility] Mon-Fri, 9am-3pm. Walk-ins are also available. Other pharmacies offer it as well.  Molnupiravir/Paxlovid is the new COVID medication we can give you if you get COVID so make sure you test if you have symptoms because we have to treat by day 5 of symptoms for it to be effective. If you are positive let us know so we can treat. If a home test is negative and your symptoms are persistent get a PCR test. Can check testing locations at [email or web address] If you are positive we will make an appointment with [place] and we will send in molnupiravir/paxlovid if you would like it. Check with your pharmacy before we meet to confirm they have it in stock, if they do not then we can get the prescription at the [facility].   Shingrix is the new shingles shot, 2 shots over 2-6 months, confirm coverage with insurance and document, then can return here for shots with nurse appt or at pharmacy  Call us if you get injured for Tdap shot   Preventive Care 42 Years and Older, Male Preventive care refers to lifestyle choices and visits with your health care provider that can promote health and wellness. This includes: A yearly physical exam. This is also called an annual wellness visit. Regular dental and eye exams. Immunizations. Screening for certain conditions. Healthy lifestyle choices, such as: Eating a healthy diet. Getting regular exercise. Not using drugs or products that contain nicotine and tobacco. Limiting alcohol use. What can I expect for my preventive care visit? Physical exam Your health care provider will check your: Height and weight. These may be used to calculate your BMI (body mass index). BMI is a measurement that tells if you are at a healthy weight. Heart rate and blood pressure. Body temperature. Skin for abnormal spots. Counseling Your health care provider may ask you questions about your: Past  medical problems. Family's medical history. Alcohol, tobacco, and drug use. Emotional well-being. Home life and relationship well-being. Sexual activity. Diet, exercise, and sleep habits. History of falls. Memory and ability to understand (cognition). Work and work Statistician. Access to firearms. What immunizations do I need? Vaccines are usually given at various ages, according to a schedule. Your health care provider will recommend vaccines for you based on your age, medical history, and lifestyle or other factors, such as travel or where you work. What tests do I need? Blood tests Lipid and cholesterol levels. These may be checked every 5 years, or more often depending on your overall health. Hepatitis C test. Hepatitis B test. Screening Lung cancer screening. You may have this screening every year starting at age 31 if you have a 30-pack-year history of smoking and currently smoke or have quit within the past 15 years. Colorectal cancer screening. All adults should have this screening starting at age 46 and continuing until age 24. Your health care provider may recommend screening at age 70 if you are at increased risk. You will have tests every 1-10 years, depending on your results and the type of screening test. Prostate cancer screening. Recommendations will vary depending on your family history and other risks. Genital exam to check for testicular cancer or hernias. Diabetes screening. This is done by checking your blood sugar (glucose) after you have not eaten for a while (fasting). You may have this done every 1-3 years. Abdominal aortic aneurysm (AAA) screening. You may need this if you are a current or former smoker.  STD (sexually transmitted disease) testing, if you are at risk. Follow these instructions at home: Eating and drinking  Eat a diet that includes fresh fruits and vegetables, whole grains, lean protein, and low-fat dairy products. Limit your intake of foods  with high amounts of sugar, saturated fats, and salt. Take vitamin and mineral supplements as recommended by your health care provider. Do not drink alcohol if your health care provider tells you not to drink. If you drink alcohol: Limit how much you have to 0-2 drinks a day. Be aware of how much alcohol is in your drink. In the [place] one drink equals one 12 oz bottle of beer (355 mL), one 5 oz glass of wine (148 mL), or one 1 oz glass of hard liquor (44 mL). Lifestyle Take daily care of your teeth and gums. Brush your teeth every morning and night with fluoride toothpaste. Floss one time each day. Stay active. Exercise for at least 30 minutes 5 or more days each week. Do not use any products that contain nicotine or tobacco, such as cigarettes, e-cigarettes, and chewing tobacco. If you need help quitting, ask your health care provider. Do not use drugs. If you are sexually active, practice safe sex. Use a condom or other form of protection to prevent STIs (sexually transmitted infections). Talk with your health care provider about taking a low-dose aspirin or statin. Find healthy ways to cope with stress, such as: Meditation, yoga, or listening to music. Journaling. Talking to a trusted person. Spending time with friends and family. Safety Always wear your seat belt while driving or riding in a vehicle. Do not drive: If you have been drinking alcohol. Do not ride with someone who has been drinking. When you are tired or distracted. While texting. Wear a helmet and other protective equipment during sports activities. If you have firearms in your house, make sure you follow all gun safety procedures. What's next? Visit your health care provider once a year for an annual wellness visit. Ask your health care provider how often you should have your eyes and teeth checked. Stay up to date on all vaccines. This information is not intended to replace advice given to you by your health care  provider. Make sure you discuss any questions you have with your health care provider. Document Revised: 11/20/2020 Document Reviewed: 09/06/2018 Elsevier Patient Education  2022 [employer].

## 2021-07-14 DIAGNOSIS — Z8673 Personal history of transient ischemic attack (TIA), and cerebral infarction without residual deficits: Secondary | ICD-10-CM | POA: Insufficient documentation

## 2021-07-14 LAB — LIPID PANEL
Cholesterol: 265 mg/dL — ABNORMAL HIGH (ref 0–200)
HDL: 92.3 mg/dL (ref >39.00)
LDL Cholesterol: 150 mg/dL — ABNORMAL HIGH (ref 0–99)
NonHDL: 172.55
Total CHOL/HDL Ratio: 3
Triglycerides: 113 mg/dL (ref 0.0–149.0)
VLDL: 22.6 mg/dL (ref 0.0–40.0)

## 2021-07-14 LAB — COMPREHENSIVE METABOLIC PANEL
ALT: 18 U/L (ref 0–53)
AST: 29 U/L (ref 0–37)
Albumin: 4.8 g/dL (ref 3.5–5.2)
Alkaline Phosphatase: 73 U/L (ref 39–117)
BUN: 18 mg/dL (ref 6–23)
CO2: 27 mEq/L (ref 19–32)
Calcium: 9.9 mg/dL (ref 8.4–10.5)
Chloride: 102 mEq/L (ref 96–112)
Creatinine, Ser: 1.11 mg/dL (ref 0.40–1.50)
GFR: 66.24 mL/min (ref >60.00)
Glucose, Bld: 84 mg/dL (ref 70–99)
Potassium: 4.1 mEq/L (ref 3.5–5.1)
Sodium: 139 mEq/L (ref 135–145)
Total Bilirubin: 0.7 mg/dL (ref 0.2–1.2)
Total Protein: 7.3 g/dL (ref 6.0–8.3)

## 2021-07-14 LAB — CBC WITH DIFFERENTIAL/PLATELET
Basophils Absolute: 0 10*3/uL (ref 0.0–0.1)
Basophils Relative: 0.7 % (ref 0.0–3.0)
Eosinophils Absolute: 0.1 10*3/uL (ref 0.0–0.7)
Eosinophils Relative: 1.4 % (ref 0.0–5.0)
HCT: 40.4 % (ref 39.0–52.0)
Hemoglobin: 13.4 g/dL (ref 13.0–17.0)
Lymphocytes Relative: 16.9 % (ref 12.0–46.0)
Lymphs Abs: 1.1 10*3/uL (ref 0.7–4.0)
MCHC: 33.1 g/dL (ref 30.0–36.0)
MCV: 94.2 fl (ref 78.0–100.0)
Monocytes Absolute: 0.5 10*3/uL (ref 0.1–1.0)
Monocytes Relative: 8.7 % (ref 3.0–12.0)
Neutro Abs: 4.6 10*3/uL (ref 1.4–7.7)
Neutrophils Relative %: 72.3 % (ref 43.0–77.0)
Platelets: 239 10*3/uL (ref 150.0–400.0)
RBC: 4.29 Mil/uL (ref 4.22–5.81)
RDW: 14.6 % (ref 11.5–15.5)
WBC: 6.3 10*3/uL (ref 4.0–10.5)

## 2021-07-14 LAB — PSA: PSA: 1.08 ng/mL (ref 0.10–4.00)

## 2021-07-14 LAB — TSH: TSH: 3.84 u[IU]/mL (ref 0.35–5.50)

## 2021-07-14 NOTE — Assessment & Plan Note (Signed)
Encourage heart healthy diet such as MIND or DASH diet, increase exercise, avoid trans fats, simple carbohydrates and processed foods, consider a krill or fish or flaxseed oil cap daily.  °

## 2021-07-14 NOTE — Assessment & Plan Note (Signed)
Encouraged moist heat and gentle stretching as tolerated. May try NSAIDs and prescription meds as directed and report if symptoms worsen or seek immediate care 

## 2021-07-14 NOTE — Assessment & Plan Note (Signed)
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 They were never able to find a specific cause and he has improved tremendously. Int he beginning he was paralyzed on the right side but he has worked hard and has good use of his arm and leg now. 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 Will continue to monitor progress and help him manage risk factors for recurrent events.

## 2021-07-14 NOTE — Assessment & Plan Note (Signed)
Patient had previously been a patient of our practice but moved to [place] for several years. He has not been seen here since 2018 so is here today to reestablish as a new patient and for annual check up. We will request records from his care providers in [place]. Patient encouraged to maintain heart healthy diet, regular exercise, adequate sleep. Consider daily probiotics. Take medications as prescribed.

## 2022-01-13 ENCOUNTER — Ambulatory Visit: Payer: PRIVATE HEALTH INSURANCE | Admitting: Family Medicine

## 2022-01-17 ENCOUNTER — Ambulatory Visit: Payer: PRIVATE HEALTH INSURANCE | Admitting: Family Medicine

## 2022-01-17 ENCOUNTER — Encounter: Payer: Self-pay | Admitting: Family Medicine

## 2022-01-17 ENCOUNTER — Ambulatory Visit (INDEPENDENT_AMBULATORY_CARE_PROVIDER_SITE_OTHER): Payer: Medicare Other | Admitting: Family Medicine

## 2022-01-17 VITALS — BP 129/75 | HR 56 | Ht 67.0 in | Wt 160.2 lb

## 2022-01-17 DIAGNOSIS — R221 Localized swelling, mass and lump, neck: Secondary | ICD-10-CM

## 2022-01-17 DIAGNOSIS — E782 Mixed hyperlipidemia: Secondary | ICD-10-CM | POA: Diagnosis not present

## 2022-01-17 LAB — LIPID PANEL
Cholesterol: 251 mg/dL — ABNORMAL HIGH (ref 0–200)
HDL: 78.5 mg/dL (ref >39.00)
LDL Cholesterol: 142 mg/dL — ABNORMAL HIGH (ref 0–99)
NonHDL: 172.51
Total CHOL/HDL Ratio: 3
Triglycerides: 155 mg/dL — ABNORMAL HIGH (ref 0.0–149.0)
VLDL: 31 mg/dL (ref 0.0–40.0)

## 2022-01-17 LAB — CBC
HCT: 39.2 % (ref 39.0–52.0)
Hemoglobin: 13.3 g/dL (ref 13.0–17.0)
MCHC: 33.8 g/dL (ref 30.0–36.0)
MCV: 93.5 fl (ref 78.0–100.0)
Platelets: 230 10*3/uL (ref 150.0–400.0)
RBC: 4.19 Mil/uL — ABNORMAL LOW (ref 4.22–5.81)
RDW: 14.7 % (ref 11.5–15.5)
WBC: 5 10*3/uL (ref 4.0–10.5)

## 2022-01-17 LAB — COMPREHENSIVE METABOLIC PANEL
ALT: 16 U/L (ref 0–53)
AST: 25 U/L (ref 0–37)
Albumin: 4.4 g/dL (ref 3.5–5.2)
Alkaline Phosphatase: 65 U/L (ref 39–117)
BUN: 16 mg/dL (ref 6–23)
CO2: 28 mEq/L (ref 19–32)
Calcium: 9.2 mg/dL (ref 8.4–10.5)
Chloride: 106 mEq/L (ref 96–112)
Creatinine, Ser: 0.93 mg/dL (ref 0.40–1.50)
GFR: 81.61 mL/min (ref >60.00)
Glucose, Bld: 86 mg/dL (ref 70–99)
Potassium: 3.8 mEq/L (ref 3.5–5.1)
Sodium: 141 mEq/L (ref 135–145)
Total Bilirubin: 0.6 mg/dL (ref 0.2–1.2)
Total Protein: 6.7 g/dL (ref 6.0–8.3)

## 2022-01-17 LAB — TSH: TSH: 3.82 u[IU]/mL (ref 0.35–5.50)

## 2022-01-17 NOTE — Progress Notes (Signed)
? ?Established Patient Office Visit ? ?Subjective   ?Patient ID: Austin Zee., male    DOB: 1950/11/30  Age: 71 y.o. MRN: [id number] ? ?CC: 76-monthroutine f/u  ? ? ?HPI ? ?Patient had physical with PCP about 6 months ago. Labs were stable at that time other than elevated cholesterol for which OTC and lifestyle management was suggested.  ? ? ?HYPERLIPIDEMIA ?- medications: Omega-3 fish oil, heart healthy diet, stays active as possible ?- compliance: good ?- medication SEs: none ?The ASCVD Risk score (Arnett DK, et al., 2019) failed to calculate for the following reasons: ?  The patient has a prior MI or stroke diagnosis ? ? ?Left neck lump ?Noticed about a year ago in the mirror, states he can occasionally palpate a lump. No symptoms; no weight loss, fatigue, fevers, etc, but didn't know if it was anything to worry about. After he had his stroke 3 years ago he was having some right-sided neck pain and they they worked it up and told him it was just cartilage.  ? ? ? ? ? ? ?ROS ?All review of systems negative except what is listed in the HPI ? ?  ?Objective:  ?  ? ?BP 129/75   Pulse (!) 56   Ht '5\' 7"'$  (1.702 m)   Wt 160 lb 3.2 oz (72.7 kg)   BMI 25.09 kg/m?  ? ? ?Physical Exam ?Vitals reviewed.  ?Constitutional:   ?   Appearance: Normal appearance. He is normal weight.  ?Neck:  ?   Comments: Prominent adam's apple; No thyroid fullness noted; cervical lymph nodes are palpable but not fixed, hard, or noticeably enlarged, no tenderness ?Cardiovascular:  ?   Rate and Rhythm: Normal rate and regular rhythm.  ?Pulmonary:  ?   Effort: Pulmonary effort is normal.  ?   Breath sounds: Normal breath sounds.  ?Musculoskeletal:  ?   Cervical back: Normal range of motion and neck supple. No tenderness.  ?Skin: ?   General: Skin is warm and dry.  ?Neurological:  ?   General: No focal deficit present.  ?   Mental Status: He is alert and oriented to person, place, and time. Mental status is at baseline.  ?Psychiatric:     ?    Mood and Affect: Mood normal.     ?   Behavior: Behavior normal.     ?   Thought Content: Thought content normal.     ?   Judgment: Judgment normal.  ? ? ? ?No results found for any visits on 01/17/22. ? ? ? ?The ASCVD Risk score (Arnett DK, et al., 2019) failed to calculate for the following reasons: ?  The patient has a prior MI or stroke diagnosis ? ?  ?Assessment & Plan:  ? ?1. Neck mass ?No significant findings on exam, but given duration of patient noticing the change, he would like to proceed with ultrasound. Labs ordered as well. Will update him with results. Patient aware of signs/symptoms requiring further/urgent evaluation.  ? ?- CBC ?- Comprehensive metabolic panel ?- Lipid panel ?- TSH ?- [place] TISSUE HEAD & NECK (NON-THYROID); Future ? ?2. Hyperlipidemia, mixed ?-Reviewed most recent lipid panel ?-Medication management: continue lifestyle management ?-Repeat CMP and lipid panel today ?-Diet low in saturated fat ?-Regular exercise - at least 30 minutes, 5 times per week ? ?- CBC ?- Comprehensive metabolic panel ?- Lipid panel ?- TSH ? ?Follow-up with PCP for routine exam in 6 months; sooner pending results or as needed.  ? ?  Terrilyn Saver, NP ? ?

## 2022-01-17 NOTE — Patient Instructions (Signed)
Glad you are doing well! ? ?Updating your basic labs today. We can also go ahead and get an ultrasound of your neck since you have noticed the left-sided mass for about a year now. Please stop by radiology on your way out so they can get this scheduled.  ?

## 2022-03-08 ENCOUNTER — Ambulatory Visit (HOSPITAL_BASED_OUTPATIENT_CLINIC_OR_DEPARTMENT_OTHER)
Admission: RE | Admit: 2022-03-08 | Discharge: 2022-03-08 | Disposition: A | Discharge status: Home / Self Care | Payer: Medicare Other | Source: Ambulatory Visit | Attending: Family Medicine | Admitting: Family Medicine

## 2022-03-08 DIAGNOSIS — R221 Localized swelling, mass and lump, neck: Secondary | ICD-10-CM | POA: Insufficient documentation

## 2022-06-17 DIAGNOSIS — C44329 Squamous cell carcinoma of skin of other parts of face: Secondary | ICD-10-CM | POA: Diagnosis not present

## 2022-06-17 DIAGNOSIS — D485 Neoplasm of uncertain behavior of skin: Secondary | ICD-10-CM | POA: Diagnosis not present

## 2022-06-27 DIAGNOSIS — C44329 Squamous cell carcinoma of skin of other parts of face: Secondary | ICD-10-CM | POA: Diagnosis not present

## 2022-07-19 ENCOUNTER — Encounter: Payer: PRIVATE HEALTH INSURANCE | Admitting: Family Medicine

## 2022-07-22 ENCOUNTER — Ambulatory Visit (INDEPENDENT_AMBULATORY_CARE_PROVIDER_SITE_OTHER): Payer: Medicare Other | Admitting: Family Medicine

## 2022-07-22 VITALS — BP 124/78 | HR 50 | Temp 98.0°F | Resp 95 | Ht 67.0 in | Wt 158.2 lb

## 2022-07-22 DIAGNOSIS — R011 Cardiac murmur, unspecified: Secondary | ICD-10-CM | POA: Diagnosis not present

## 2022-07-22 DIAGNOSIS — C4492 Squamous cell carcinoma of skin, unspecified: Secondary | ICD-10-CM | POA: Diagnosis not present

## 2022-07-22 DIAGNOSIS — Z8673 Personal history of transient ischemic attack (TIA), and cerebral infarction without residual deficits: Secondary | ICD-10-CM | POA: Diagnosis not present

## 2022-07-22 DIAGNOSIS — Z0000 Encounter for general adult medical examination without abnormal findings: Secondary | ICD-10-CM

## 2022-07-22 DIAGNOSIS — K219 Gastro-esophageal reflux disease without esophagitis: Secondary | ICD-10-CM

## 2022-07-22 DIAGNOSIS — E782 Mixed hyperlipidemia: Secondary | ICD-10-CM | POA: Diagnosis not present

## 2022-07-22 LAB — COMPREHENSIVE METABOLIC PANEL
ALT: 16 U/L (ref 0–53)
AST: 27 U/L (ref 0–37)
Albumin: 4.5 g/dL (ref 3.5–5.2)
Alkaline Phosphatase: 64 U/L (ref 39–117)
BUN: 15 mg/dL (ref 6–23)
CO2: 29 mEq/L (ref 19–32)
Calcium: 9.4 mg/dL (ref 8.4–10.5)
Chloride: 103 mEq/L (ref 96–112)
Creatinine, Ser: 0.89 mg/dL (ref 0.40–1.50)
GFR: 84.87 mL/min (ref >60.00)
Glucose, Bld: 85 mg/dL (ref 70–99)
Potassium: 4 mEq/L (ref 3.5–5.1)
Sodium: 138 mEq/L (ref 135–145)
Total Bilirubin: 0.6 mg/dL (ref 0.2–1.2)
Total Protein: 6.9 g/dL (ref 6.0–8.3)

## 2022-07-22 LAB — LIPID PANEL
Cholesterol: 246 mg/dL — ABNORMAL HIGH (ref 0–200)
HDL: 80.9 mg/dL (ref >39.00)
LDL Cholesterol: 144 mg/dL — ABNORMAL HIGH (ref 0–99)
NonHDL: 165.55
Total CHOL/HDL Ratio: 3
Triglycerides: 110 mg/dL (ref 0.0–149.0)
VLDL: 22 mg/dL (ref 0.0–40.0)

## 2022-07-22 LAB — CBC
HCT: 40.1 % (ref 39.0–52.0)
Hemoglobin: 13.2 g/dL (ref 13.0–17.0)
MCHC: 32.9 g/dL (ref 30.0–36.0)
MCV: 93.9 fl (ref 78.0–100.0)
Platelets: 222 10*3/uL (ref 150.0–400.0)
RBC: 4.27 Mil/uL (ref 4.22–5.81)
RDW: 14.8 % (ref 11.5–15.5)
WBC: 6.1 10*3/uL (ref 4.0–10.5)

## 2022-07-22 LAB — TSH: TSH: 4.57 u[IU]/mL (ref 0.35–5.50)

## 2022-07-22 NOTE — Assessment & Plan Note (Signed)
Encourage heart healthy diet such as MIND or DASH diet, increase exercise, avoid trans fats, simple carbohydrates and processed foods, consider a krill or fish or flaxseed oil cap daily.  °

## 2022-07-22 NOTE — Patient Instructions (Addendum)
Austin Ramirez is Mediterranean with full fat dairy  Immunizations to consider RSV (respiratory syncitial virus) vaccine at pharmacy, Arexvy Covid booster when new version out at pharmacy High dose flu shot  Shingrix is the new shingles shot, 2 shots over 2-6 months, confirm coverage with insurance and document, then can return here for shots with nurse appt or at pharmacy Tetanus due 2026 or sooner if injured Prevnar 20   Multivaimin with minerals, fish or krill or flaxseed oil daily and Vitamin D 2000 IU daily  Preventive Care Austin Ramirez, Male Preventive care refers to lifestyle choices and visits with your health care provider that can promote health and wellness. Preventive care visits are also called wellness exams. What can I expect for my preventive care visit? Counseling During your preventive care visit, your health care provider may ask about your: Medical history, including: Past medical problems. Family medical history. History of falls. Current health, including: Emotional well-being. Home life and relationship well-being. Sexual activity. Memory and ability to understand (cognition). Lifestyle, including: Alcohol, nicotine or tobacco, and drug use. Access to firearms. Ramirez, exercise, and sleep habits. Work and work Statistician. Sunscreen use. Safety issues such as seatbelt and bike helmet use. Physical exam Your health care provider will check your: Height and weight. These may be used to calculate your BMI (body mass index). BMI is a measurement that tells if you are at a healthy weight. Waist circumference. This measures the distance around your waistline. This measurement also tells if you are at a healthy weight and may help predict your risk of certain diseases, such as type 2 diabetes and high blood pressure. Heart rate and blood pressure. Body temperature. Skin for abnormal spots. What immunizations do I need?  Vaccines are usually given at various  ages, according to a schedule. Your health care provider will recommend vaccines for you based on your age, medical history, and lifestyle or other factors, such as travel or where you work. What tests do I need? Screening Your health care provider may recommend screening tests for certain conditions. This may include: Lipid and cholesterol levels. Diabetes screening. This is done by checking your blood sugar (glucose) after you have not eaten for a while (fasting). Hepatitis C test. Hepatitis B test. HIV (human immunodeficiency virus) test. STI (sexually transmitted infection) testing, if you are at risk. Lung cancer screening. Colorectal cancer screening. Prostate cancer screening. Abdominal aortic aneurysm (AAA) screening. You may need this if you are a current or former smoker. Talk with your health care provider about your test results, treatment options, and if necessary, the need for more tests. Follow these instructions at home: Eating and drinking  Eat a Ramirez that includes fresh fruits and vegetables, whole grains, lean protein, and low-fat dairy products. Limit your intake of foods with high amounts of sugar, saturated fats, and salt. Take vitamin and mineral supplements as recommended by your health care provider. Do not drink alcohol if your health care provider tells you not to drink. If you drink alcohol: Limit how much you have to 0-2 drinks a day. Know how much alcohol is in your drink. In the [place] one drink equals one 12 oz bottle of beer (355 mL), one 5 oz glass of wine (148 mL), or one 1 oz glass of hard liquor (44 mL). Lifestyle Brush your teeth every morning and night with fluoride toothpaste. Floss one time each day. Exercise for at least 30 minutes 5 or more days each week. Do not  use any products that contain nicotine or tobacco. These products include cigarettes, chewing tobacco, and vaping devices, such as e-cigarettes. If you need help quitting, ask your health  care provider. Do not use drugs. If you are sexually active, practice safe sex. Use a condom or other form of protection to prevent STIs. Take aspirin only as told by your health care provider. Make sure that you understand how much to take and what form to take. Work with your health care provider to find out whether it is safe and beneficial for you to take aspirin daily. Ask your health care provider if you need to take a cholesterol-lowering medicine (statin). Find healthy ways to manage stress, such as: Meditation, yoga, or listening to music. Journaling. Talking to a trusted person. Spending time with friends and family. Safety Always wear your seat belt while driving or riding in a vehicle. Do not drive: If you have been drinking alcohol. Do not ride with someone who has been drinking. When you are tired or distracted. While texting. If you have been using any mind-altering substances or drugs. Wear a helmet and other protective equipment during sports activities. If you have firearms in your house, make sure you follow all gun safety procedures. Minimize exposure to UV radiation to reduce your risk of skin cancer. What's next? Visit your health care provider once a year for an annual wellness visit. Ask your health care provider how often you should have your eyes and teeth checked. Stay up to date on all vaccines. This information is not intended to replace advice given to you by your health care provider. Make sure you discuss any questions you have with your health care provider. Document Revised: 03/10/2021 Document Reviewed: 03/10/2021 Elsevier Patient Education  [place].

## 2022-07-22 NOTE — Progress Notes (Unsigned)
Subjective:    Patient ID: Austin Ramirez., male    DOB: [birth year], 71 y.o.   MRN: [id number]  No chief complaint on file.   HPI Patient is in today for annual preventative exam and follow up on chronic medical concerns. No recent febrile illness or acute hospitalizations. Denies CP/palp/SOB/HA/congestion/fevers/GI or GU c/o. 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  Family History  Problem Relation Age of Onset   Arthritis Mother    Heart disease Mother    Arthritis Father    Heart disease Father    Hypertension Father    ADD / ADHD Neg Hx     Social History   Socioeconomic History   Marital status: Married    Spouse name: Not on file   Number of children: Not on file   Years of education: Not on file   Highest education level: Not on file  Occupational History   Occupation: Retired  Tobacco Use   Smoking status: Never   Smokeless tobacco: Never  Vaping Use   Vaping Use: Never used  Substance and Sexual Activity   Alcohol use: Yes    Alcohol/week: 0.0 standard drinks of alcohol   Drug use: No   Sexual activity: Yes    Comment: lives with wife, ultra marathon runner, retired from [employer] work,   Other Topics Concern   Not on file  Social History Narrative   Not on file   Social Determinants of Health   Financial Resource Strain: Not on file  Food Insecurity: Not on file  Transportation Needs: Not on file  Physical  Activity: Not on file  Stress: Not on file  Social Connections: Not on file  Intimate Partner Violence: Not on file    Outpatient Medications Prior to Visit  Medication Sig Dispense Refill   b complex vitamins capsule Take 1 capsule by mouth daily.     clobetasol cream (TEMOVATE) 0.05 % Apply to effected area qd prn. 60 g 1   DHEA 10 MG CAPS Take by mouth daily.     Multiple Vitamin (MULTIVITAMIN) tablet Take 1 tablet by mouth daily.     multivitamin-lutein (OCUVITE-LUTEIN) CAPS capsule Take 1 capsule by mouth daily.     Omega-3 Fatty Acids (FISH OIL PO) Take by mouth 3 x daily with food.     No facility-administered medications prior to visit.    Allergies  Allergen Reactions   Almond Meal Swelling    Review of Systems  Constitutional:  Negative for chills, fever and malaise/fatigue.  HENT:  Negative for congestion and hearing loss.   Eyes:  Negative for discharge.  Respiratory:  Negative for cough, sputum production and shortness of breath.   Cardiovascular:  Negative for chest pain, palpitations and leg swelling.  Gastrointestinal:  Negative for abdominal pain, blood in stool, constipation, diarrhea, heartburn, nausea and vomiting.  Genitourinary:  Negative for dysuria,  frequency, hematuria and urgency.  Musculoskeletal:  Negative for back pain, falls and myalgias.  Skin:  Negative for rash.  Neurological:  Negative for dizziness, sensory change, loss of consciousness, weakness and headaches.  Endo/Heme/Allergies:  Negative for environmental allergies. Does not bruise/bleed easily.  Psychiatric/Behavioral:  Negative for depression and suicidal ideas. The patient is not nervous/anxious and does not have insomnia.        Objective:    Physical Exam Constitutional:      General: He is not in acute distress.    Appearance: Normal appearance. He is not ill-appearing or toxic-appearing.  HENT:     Head: Normocephalic and atraumatic.     Right Ear: External ear normal.      Left Ear: External ear normal.     Nose: Nose normal.     Mouth/Throat:     Mouth: Mucous membranes are moist.  Eyes:     General:        Right eye: No discharge.        Left eye: No discharge.     Extraocular Movements: Extraocular movements intact.     Pupils: Pupils are equal, round, and reactive to light.  Cardiovascular:     Rate and Rhythm: Normal rate and regular rhythm.  Pulmonary:     Effort: Pulmonary effort is normal.     Breath sounds: Normal breath sounds. No wheezing.  Abdominal:     Palpations: Abdomen is soft. There is no mass.     Tenderness: There is no guarding.  Musculoskeletal:        General: No tenderness.     Cervical back: Normal range of motion and neck supple.     Right lower leg: No edema.     Left lower leg: No edema.  Skin:    Findings: No rash.  Neurological:     Mental Status: He is alert and oriented to person, place, and time.  Psychiatric:        Behavior: Behavior normal.     There were no vitals taken for this visit. Wt Readings from Last 3 Encounters:  01/17/22 160 lb 3.2 oz (72.7 kg)  07/13/21 148 lb (67.1 kg)  04/11/17 165 lb 12.8 oz (75.2 kg)    Diabetic Foot Exam - Simple   No data filed    Lab Results  Component Value Date   WBC 5.0 01/17/2022   HGB 13.3 01/17/2022   HCT 39.2 01/17/2022   PLT 230.0 01/17/2022   GLUCOSE 86 01/17/2022   CHOL 251 (H) 01/17/2022   TRIG 155.0 (H) 01/17/2022   HDL 78.50 01/17/2022   LDLDIRECT 144.0 12/08/2015   LDLCALC 142 (H) 01/17/2022   ALT 16 01/17/2022   AST 25 01/17/2022   NA 141 01/17/2022   K 3.8 01/17/2022   CL 106 01/17/2022   CREATININE 0.93 01/17/2022   BUN 16 01/17/2022   CO2 28 01/17/2022   TSH 3.82 01/17/2022   PSA 1.08 07/13/2021    Lab Results  Component Value Date   TSH 3.82 01/17/2022   Lab Results  Component Value Date   WBC 5.0 01/17/2022   HGB 13.3 01/17/2022   HCT 39.2 01/17/2022   MCV 93.5 01/17/2022   PLT 230.0 01/17/2022   Lab Results   Component Value Date   NA 141 01/17/2022   K 3.8 01/17/2022   CO2 28 01/17/2022   GLUCOSE 86 01/17/2022   BUN 16 01/17/2022   CREATININE 0.93 01/17/2022   BILITOT 0.6 01/17/2022  ALKPHOS 65 01/17/2022   AST 25 01/17/2022   ALT 16 01/17/2022   PROT 6.7 01/17/2022   ALBUMIN 4.4 01/17/2022   CALCIUM 9.2 01/17/2022   GFR 81.61 01/17/2022   Lab Results  Component Value Date   CHOL 251 (H) 01/17/2022   Lab Results  Component Value Date   HDL 78.50 01/17/2022   Lab Results  Component Value Date   LDLCALC 142 (H) 01/17/2022   Lab Results  Component Value Date   TRIG 155.0 (H) 01/17/2022   Lab Results  Component Value Date   CHOLHDL 3 01/17/2022   No results found for: "HGBA1C"     Assessment & Plan:   Problem List Items Addressed This Visit     Preventative health care    Patient encouraged to maintain heart healthy diet, regular exercise, adequate sleep. Consider daily probiotics. Take medications as prescribed. Labs ordered and reviewed Colonoscopy Immunizations to consider RSV (respiratory syncitial virus) vaccine at pharmacy, Arexvy Covid booster when new version out at pharmacy High dose flu shot  Shingrix is the new shingles shot, 2 shots over 2-6 months, confirm coverage with insurance and document, then can return here for shots with nurse appt or at pharmacy Tetanus due 2026 or sooner if injured Prevnar 20      Hyperlipidemia, mixed    Encourage heart healthy diet such as MIND or DASH diet, increase exercise, avoid trans fats, simple carbohydrates and processed foods, consider a krill or fish or flaxseed oil cap daily.        I am having Austin Ramirez. maintain his multivitamin, Omega-3 Fatty Acids (FISH OIL PO), multivitamin-lutein, DHEA, b complex vitamins, and clobetasol cream.  No orders of the defined types were placed in this encounter.    Penni Homans, MD

## 2022-07-22 NOTE — Assessment & Plan Note (Signed)
Patient encouraged to maintain heart healthy diet, regular exercise, adequate sleep. Consider daily probiotics. Take medications as prescribed. Labs ordered and reviewed Colonoscopy Immunizations to consider RSV (respiratory syncitial virus) vaccine at pharmacy, Arexvy Covid booster when new version out at pharmacy High dose flu shot  Shingrix is the new shingles shot, 2 shots over 2-6 months, confirm coverage with insurance and document, then can return here for shots with nurse appt or at pharmacy Tetanus due 2026 or sooner if injured Prevnar 20 He declines shots.

## 2022-07-23 NOTE — Assessment & Plan Note (Signed)
Well controlled with dietary and lifestyle adjustments

## 2022-07-23 NOTE — Assessment & Plan Note (Signed)
Stable and asymptomatic  

## 2022-07-23 NOTE — Assessment & Plan Note (Signed)
He continues to do well years after his stroke.

## 2022-08-03 ENCOUNTER — Ambulatory Visit (INDEPENDENT_AMBULATORY_CARE_PROVIDER_SITE_OTHER): Payer: Medicare Other | Admitting: *Deleted

## 2022-08-03 DIAGNOSIS — Z1211 Encounter for screening for malignant neoplasm of colon: Secondary | ICD-10-CM

## 2022-08-03 DIAGNOSIS — Z0000 Encounter for general adult medical examination without abnormal findings: Secondary | ICD-10-CM | POA: Diagnosis not present

## 2022-08-03 NOTE — Progress Notes (Signed)
Subjective:   Austin Seto. is a 71 y.o. male who presents for Medicare Annual/Subsequent preventive examination.  I connected with  Rosalva Ferron. on 08/03/22 by a audio enabled telemedicine application and verified that I am speaking with the correct person using two identifiers.  Patient Location: Home  Provider Location: Office/Clinic  I discussed the limitations of evaluation and management by telemedicine. The patient expressed understanding and agreed to proceed.   Review of Systems    Defer to PCP Cardiac Risk Factors include: advanced age (>57mn, >>79women);dyslipidemia;male gender     Objective:    There were no vitals filed for this visit. There is no height or weight on file to calculate BMI.     08/03/2022   10:28 AM  Advanced Directives  Does Patient Have a Medical Advance Directive? Yes  Type of AParamedicof AMamersLiving will  Copy of HValliantin Chart? No - copy requested    Current Medications (verified) Outpatient Encounter Medications as of 08/03/2022  Medication Sig   b complex vitamins capsule Take 1 capsule by mouth daily.   clobetasol cream (TEMOVATE) 0.05 % Apply to effected area qd prn.   DHEA 10 MG CAPS Take by mouth daily.   Multiple Vitamin (MULTIVITAMIN) tablet Take 1 tablet by mouth daily.   multivitamin-lutein (OCUVITE-LUTEIN) CAPS capsule Take 1 capsule by mouth daily.   Omega-3 Fatty Acids (FISH OIL PO) Take by mouth 3 x daily with food.   No facility-administered encounter medications on file as of 08/03/2022.    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 Family History  Problem Relation Age of Onset   Arthritis Mother    Heart disease Mother    Arthritis Father    Heart disease Father    Hypertension Father    ADD / ADHD Neg Hx    Social History   Socioeconomic History   Marital status: Married    Spouse name: Not on file   Number of children: Not on file   Years of education: Not on file   Highest education level: Not on file  Occupational History   Occupation: Retired  Tobacco Use   Smoking status: Never   Smokeless tobacco: Never  Vaping Use   Vaping Use: Never used  Substance and Sexual Activity   Alcohol use: Yes    Alcohol/week: 0.0 standard drinks of alcohol   Drug use: No   Sexual activity: Yes    Comment: lives with wife, ultra marathon runner, retired from [employer],   Other Topics Concern   Not on file  Social History Narrative   Not on file   Social Determinants of Health   Financial Resource Strain: [place] (08/03/2022)   Overall Financial Resource Strain (CARDIA)    Difficulty of Paying Living Expenses: Not hard at all  Food  Insecurity: No Food Insecurity (08/03/2022)   Hunger Vital Sign    Worried About Running Out of Food in the Last Year: Never true    Ran Out of Food in the Last Year: Never true  Transportation Needs: No Transportation Needs (08/03/2022)   PRAPARE - Hydrologist (Medical): No    Lack of Transportation (Non-Medical): No  Physical Activity: Sufficiently Active (08/03/2022)   Exercise Vital Sign    Days of Exercise per Week: 4 days    Minutes of Exercise per Session: 150+ min  Stress: No Stress Concern Present (08/03/2022)   [place]    Feeling of Stress  : Not at all  Social Connections: [place] (08/03/2022)   Social Connection and Isolation Panel [NHANES]    Frequency of Communication with Friends and Family: More than three times a week    Frequency of Social Gatherings with Friends and Family: More than three times a week    Attends Religious Services: More than 4 times per year    Active Member of [employer] or Organizations: Yes    Attends Music therapist: More than 4 times per year    Marital Status: Married    Tobacco Counseling Counseling given: Not Answered   Clinical Intake:  Pre-visit preparation completed: Yes  Pain : No/denies pain  Diabetes: No  How often do you need to have someone help you when you read instructions, pamphlets, or other written materials from your doctor or pharmacy?: 1 - Never  Activities of Daily Living    08/03/2022   10:29 AM  In your present state of health, do you have any difficulty performing the following activities:  Hearing? 0  Vision? 0  Difficulty concentrating or making decisions? 0  Walking or climbing stairs? 0  Dressing or bathing? 0  Doing errands, shopping? 0  Preparing Food and eating ? N  Using the Toilet? N  In the past six months, have you accidently leaked urine? Y  Comment only if he waits too long to get to the restroom  Do you have problems with loss of bowel control? N  Managing your Medications? N  Managing your Finances? N  Housekeeping or managing your Housekeeping? N    Patient Care Team: Mosie Lukes, MD as PCP - General (Family Medicine)  Indicate any recent Medical Services you may have received from other than [facility] providers in the past year (date may be approximate).     Assessment:   This is a routine wellness examination for Austin Ramirez.  Hearing/Vision screen No results found.  Dietary issues and exercise activities discussed: Current Exercise Habits: Home exercise routine, Type of exercise:  calisthenics;stretching;walking;Other - see comments (stationary bike), Time (Minutes): > 60, Frequency (Times/Week): 4, Weekly Exercise (Minutes/Week): 0, Intensity: Moderate, Exercise limited by: None identified   Goals Addressed   None    Depression Screen    08/03/2022   10:28 AM 07/22/2022   11:00 AM 07/13/2021    2:46 PM 10/11/2016   11:26 AM  PHQ 2/9 Scores  PHQ - 2 Score 0 0 0 0  PHQ- 9 Score  0 1     Fall Risk    08/03/2022   10:28 AM 07/22/2022   11:00 AM 07/13/2021    2:42 PM 10/11/2016   11:26 AM  Fall Risk   Falls in the past year? 0 0 1 No  Number falls in past yr: 0  0 0   Injury with Fall? 0 0 0   Risk for fall due to : No Fall Risks  History of fall(s)   Follow up Falls evaluation completed Falls evaluation completed Falls evaluation completed     [place]:  Any stairs in or around the home? Yes  If so, are there any without handrails? No  Home free of loose throw rugs in walkways, pet beds, electrical cords, etc? Yes  Adequate lighting in your home to reduce risk of falls? Yes   ASSISTIVE DEVICES UTILIZED TO PREVENT FALLS:  Life alert? No  Use of a cane, walker or w/c? No  Grab bars in the bathroom? Yes  Shower chair or bench in shower? No  Elevated toilet seat or a handicapped toilet? No   TIMED UP AND GO:  Was the test performed?  No, audio visit .    Cognitive Function:        08/03/2022   10:36 AM  6CIT Screen  What Year? 0 points  What month? 0 points  What time? 0 points  Count back from 20 0 points  Months in reverse 0 points  Repeat phrase 0 points  Total Score 0 points    Immunizations Immunization History  Administered Date(s) Administered   Hepatitis A, Adult 01/20/2015   Hepatitis B, adult 01/20/2015, 02/24/2015   PFIZER(Purple Top)SARS-COV-2 Vaccination 02/29/2020, 03/21/2020, 09/25/2020, 03/04/2021   Tdap 01/20/2015    TDAP status: Due, Education has been provided regarding the  importance of this vaccine. Advised may receive this vaccine at local pharmacy or Health Dept. Aware to provide a copy of the vaccination record if obtained from local pharmacy or Health Dept. Verbalized acceptance and understanding.  Flu Vaccine status: Due, Education has been provided regarding the importance of this vaccine. Advised may receive this vaccine at local pharmacy or Health Dept. Aware to provide a copy of the vaccination record if obtained from local pharmacy or Health Dept. Verbalized acceptance and understanding.  Pneumococcal vaccine status: Due, Education has been provided regarding the importance of this vaccine. Advised may receive this vaccine at local pharmacy or Health Dept. Aware to provide a copy of the vaccination record if obtained from local pharmacy or Health Dept. Verbalized acceptance and understanding.  Covid-19 vaccine status: Information provided on how to obtain vaccines.   Qualifies for Shingles Vaccine? Yes   Zostavax completed No   Shingrix Completed?: No.    Education has been provided regarding the importance of this vaccine. Patient has been advised to call insurance company to determine out of pocket expense if they have not yet received this vaccine. Advised may also receive vaccine at local pharmacy or Health Dept. Verbalized acceptance and understanding.  Screening Tests Health Maintenance  Topic Date Due   Hepatitis C Screening  Never done   Zoster Vaccines- Shingrix (1 of 2) Never done   COLONOSCOPY (Pts 45-38yr Insurance coverage will need to be confirmed)  Never done   Pneumonia Vaccine 73 Years old (1 - PCV) Never done   Medicare Annual Wellness (AWV)  10/29/2014   COVID-19 Vaccine (5 - Pfizer risk series) 04/29/2021   INFLUENZA VACCINE  12/25/2022 (Originally 04/26/2022)   TETANUS/TDAP  01/19/2025   HPV VACCINES  Aged Out    Health Maintenance  Health Maintenance Due  Topic Date Due   Hepatitis C Screening  Never done   Zoster  Vaccines- Shingrix (1 of 2) Never done   COLONOSCOPY (Pts 45-464yrInsurance  coverage will need to be confirmed)  Never done   Pneumonia Vaccine 4+ Years old (1 - PCV) Never done   Medicare Annual Wellness (AWV)  10/29/2014   COVID-19 Vaccine (5 - Pfizer risk series) 04/29/2021    Colorectal cancer screening: Referral to GI placed 08/03/22. Pt aware the office will call re: appt.  Lung Cancer Screening: (Low Dose CT Chest recommended if Age 11-80 years, 30 pack-year currently smoking OR have quit w/in 15years.) does not qualify.   Lung Cancer Screening Referral: N/a  Additional Screening:  Hepatitis C Screening: does qualify; Completed n/a  Vision Screening: Recommended annual ophthalmology exams for early detection of glaucoma and other disorders of the eye. Is the patient up to date with their annual eye exam?  Yes  Who is the provider or what is the name of the office in which the patient attends annual eye exams? [facility] If pt is not established with a provider, would they like to be referred to a provider to establish care? No .   Dental Screening: Recommended annual dental exams for proper oral hygiene  Community Resource Referral / Chronic Care Management: CRR required this visit?  No   CCM required this visit?  No      Plan:     I have personally reviewed and noted the following in the patient's chart:   Medical and social history Use of alcohol, tobacco or illicit drugs  Current medications and supplements including opioid prescriptions. Patient is not currently taking opioid prescriptions. Functional ability and status Nutritional status Physical activity Advanced directives List of other physicians Hospitalizations, surgeries, and ER visits in previous 12 months Vitals Screenings to include cognitive, depression, and falls Referrals and appointments  In addition, I have reviewed and discussed with patient certain preventive protocols, quality metrics,  and best practice recommendations. A written personalized care plan for preventive services as well as general preventive health recommendations were provided to patient.   Due to this being a telephonic visit, the after visit summary with patients personalized plan was offered to patient via mail or my-chart. Patient would like to access on my-chart.  Beatris Ship, [place]   08/03/2022   Nurse Notes: None

## 2022-08-03 NOTE — Addendum Note (Signed)
Addended by: Beatris Ship L on: 08/03/2022 11:49 AM   Modules accepted: Orders, Level of Service

## 2022-08-03 NOTE — Patient Instructions (Signed)
Austin Ramirez , Thank you for taking time to come for your Medicare Wellness Visit. I appreciate your ongoing commitment to your health goals. Please review the following plan we discussed and let me know if I can assist you in the future.   These are the goals we discussed:  Goals   None     This is a list of the screening recommended for you and due dates:  Health Maintenance  Topic Date Due   Hepatitis C Screening: USPSTF Recommendation to screen - Ages 60-79 yo.  Never done   Zoster (Shingles) Vaccine (1 of 2) Never done   Colon Cancer Screening  Never done   Pneumonia Vaccine (1 - PCV) Never done   COVID-19 Vaccine (5 - Pfizer risk series) 04/29/2021   Flu Shot  12/25/2022*   Medicare Annual Wellness Visit  08/04/2023   Tetanus Vaccine  01/19/2025   HPV Vaccine  Aged Out  *Topic was postponed. The date shown is not the original due date.     Next appointment: Follow up in one year for your annual wellness visit.   Preventive Care 62 Years and Older, Male Preventive care refers to lifestyle choices and visits with your health care provider that can promote health and wellness. What does preventive care include? A yearly physical exam. This is also called an annual well check. Dental exams once or twice a year. Routine eye exams. Ask your health care provider how often you should have your eyes checked. Personal lifestyle choices, including: Daily care of your teeth and gums. Regular physical activity. Eating a healthy diet. Avoiding tobacco and drug use. Limiting alcohol use. Practicing safe sex. Taking low doses of aspirin every day. Taking vitamin and mineral supplements as recommended by your health care provider. What happens during an annual well check? The services and screenings done by your health care provider during your annual well check will depend on your age, overall health, lifestyle risk factors, and family history of disease. Counseling  Your health  care provider may ask you questions about your: Alcohol use. Tobacco use. Drug use. Emotional well-being. Home and relationship well-being. Sexual activity. Eating habits. History of falls. Memory and ability to understand (cognition). Work and work Statistician. Screening  You may have the following tests or measurements: Height, weight, and BMI. Blood pressure. Lipid and cholesterol levels. These may be checked every 5 years, or more frequently if you are over 71 years old. Skin check. Lung cancer screening. You may have this screening every year starting at age 57 if you have a 30-pack-year history of smoking and currently smoke or have quit within the past 15 years. Fecal occult blood test (FOBT) of the stool. You may have this test every year starting at age 57. Flexible sigmoidoscopy or colonoscopy. You may have a sigmoidoscopy every 5 years or a colonoscopy every 10 years starting at age 41. Prostate cancer screening. Recommendations will vary depending on your family history and other risks. Hepatitis C blood test. Hepatitis B blood test. Sexually transmitted disease (STD) testing. Diabetes screening. This is done by checking your blood sugar (glucose) after you have not eaten for a while (fasting). You may have this done every 1-3 years. Abdominal aortic aneurysm (AAA) screening. You may need this if you are a current or former smoker. Osteoporosis. You may be screened starting at age 42 if you are at high risk. Talk with your health care provider about your test results, treatment options, and if necessary, the  need for more tests. Vaccines  Your health care provider may recommend certain vaccines, such as: Influenza vaccine. This is recommended every year. Tetanus, diphtheria, and acellular pertussis (Tdap, Td) vaccine. You may need a Td booster every 10 years. Zoster vaccine. You may need this after age 41. Pneumococcal 13-valent conjugate (PCV13) vaccine. One dose is  recommended after age 7. Pneumococcal polysaccharide (PPSV23) vaccine. One dose is recommended after age 31. Talk to your health care provider about which screenings and vaccines you need and how often you need them. This information is not intended to replace advice given to you by your health care provider. Make sure you discuss any questions you have with your health care provider. Document Released: 10/09/2015 Document Revised: 06/01/2016 Document Reviewed: 07/14/2015 Elsevier Interactive Patient Education  2017 [place] Prevention in the Home Falls can cause injuries. They can happen to people of all ages. There are many things you can do to make your home safe and to help prevent falls. What can I do on the outside of my home? Regularly fix the edges of walkways and driveways and fix any cracks. Remove anything that might make you trip as you walk through a door, such as a raised step or threshold. Trim any bushes or trees on the path to your home. Use bright outdoor lighting. Clear any walking paths of anything that might make someone trip, such as rocks or tools. Regularly check to see if handrails are loose or broken. Make sure that both sides of any steps have handrails. Any raised decks and porches should have guardrails on the edges. Have any leaves, snow, or ice cleared regularly. Use sand or salt on walking paths during winter. Clean up any spills in your garage right away. This includes oil or grease spills. What can I do in the bathroom? Use night lights. Install grab bars by the toilet and in the tub and shower. Do not use towel bars as grab bars. Use non-skid mats or decals in the tub or shower. If you need to sit down in the shower, use a plastic, non-slip stool. Keep the floor dry. Clean up any water that spills on the floor as soon as it happens. Remove soap buildup in the tub or shower regularly. Attach bath mats securely with double-sided non-slip rug  tape. Do not have throw rugs and other things on the floor that can make you trip. What can I do in the bedroom? Use night lights. Make sure that you have a light by your bed that is easy to reach. Do not use any sheets or blankets that are too big for your bed. They should not hang down onto the floor. Have a firm chair that has side arms. You can use this for support while you get dressed. Do not have throw rugs and other things on the floor that can make you trip. What can I do in the kitchen? Clean up any spills right away. Avoid walking on wet floors. Keep items that you use a lot in easy-to-reach places. If you need to reach something above you, use a strong step stool that has a grab bar. Keep electrical cords out of the way. Do not use floor polish or wax that makes floors slippery. If you must use wax, use non-skid floor wax. Do not have throw rugs and other things on the floor that can make you trip. What can I do with my stairs? Do not leave any items on the  stairs. Make sure that there are handrails on both sides of the stairs and use them. Fix handrails that are broken or loose. Make sure that handrails are as long as the stairways. Check any carpeting to make sure that it is firmly attached to the stairs. Fix any carpet that is loose or worn. Avoid having throw rugs at the top or bottom of the stairs. If you do have throw rugs, attach them to the floor with carpet tape. Make sure that you have a light switch at the top of the stairs and the bottom of the stairs. If you do not have them, ask someone to add them for you. What else can I do to help prevent falls? Wear shoes that: Do not have high heels. Have rubber bottoms. Are comfortable and fit you well. Are closed at the toe. Do not wear sandals. If you use a stepladder: Make sure that it is fully opened. Do not climb a closed stepladder. Make sure that both sides of the stepladder are locked into place. Ask someone to  hold it for you, if possible. Clearly mark and make sure that you can see: Any grab bars or handrails. First and last steps. Where the edge of each step is. Use tools that help you move around (mobility aids) if they are needed. These include: Canes. Walkers. Scooters. Crutches. Turn on the lights when you go into a dark area. Replace any light bulbs as soon as they burn out. Set up your furniture so you have a clear path. Avoid moving your furniture around. If any of your floors are uneven, fix them. If there are any pets around you, be aware of where they are. Review your medicines with your doctor. Some medicines can make you feel dizzy. This can increase your chance of falling. Ask your doctor what other things that you can do to help prevent falls. This information is not intended to replace advice given to you by your health care provider. Make sure you discuss any questions you have with your health care provider. Document Released: 07/09/2009 Document Revised: 02/18/2016 Document Reviewed: 10/17/2014 Elsevier Interactive Patient Education  2017 [employer].

## 2022-08-09 DIAGNOSIS — Z8589 Personal history of malignant neoplasm of other organs and systems: Secondary | ICD-10-CM | POA: Diagnosis not present

## 2022-08-09 DIAGNOSIS — C44329 Squamous cell carcinoma of skin of other parts of face: Secondary | ICD-10-CM | POA: Diagnosis not present

## 2022-08-09 DIAGNOSIS — Z5189 Encounter for other specified aftercare: Secondary | ICD-10-CM | POA: Diagnosis not present

## 2022-08-29 ENCOUNTER — Emergency Department
Admission: EM | Admit: 2022-08-29 | Discharge: 2022-08-29 | Discharge status: Against Medical Advice | Payer: Medicare Other | Source: Home / Self Care

## 2022-08-29 DIAGNOSIS — S42201A Unspecified fracture of upper end of right humerus, initial encounter for closed fracture: Secondary | ICD-10-CM | POA: Diagnosis not present

## 2022-09-05 DIAGNOSIS — S42201A Unspecified fracture of upper end of right humerus, initial encounter for closed fracture: Secondary | ICD-10-CM | POA: Diagnosis not present

## 2022-09-15 ENCOUNTER — Telehealth: Payer: Self-pay

## 2022-09-15 NOTE — Telephone Encounter (Signed)
        Patient  visited [facility] on 08/29/2022  for patient left without being seen before triage.   Telephone encounter: 1st  A HIPAA compliant voice message was left requesting a return call.  Instructed patient to call back at [phone].   [place] Resource Care Guide   [email or web address]  ?? [phone]   Website: [email or web address]  [email or web address]

## 2022-09-16 ENCOUNTER — Other Ambulatory Visit: Payer: Self-pay

## 2022-09-16 ENCOUNTER — Telehealth: Payer: Self-pay | Admitting: Family Medicine

## 2022-09-16 ENCOUNTER — Telehealth: Payer: Self-pay

## 2022-09-16 MED ORDER — CLOBETASOL PROPIONATE 0.05 % EX CREA: TOPICAL_CREAM | CUTANEOUS | 1 refills | Status: DC

## 2022-09-16 NOTE — Telephone Encounter (Signed)
Prescription Request  09/16/2022  Is this a "Controlled Substance" medicine? No  LOV: 07/22/2022  What is the name of the medication or equipment?   clobetasol cream (TEMOVATE) 0.05 % [[id number]]   Have you contacted your pharmacy to request a refill? No   Which pharmacy would you like this sent to?  [facility], [place] [address] [place] [address] Phone: [phone] Fax: [phone]    Patient notified that their request is being sent to the clinical staff for review and that they should receive a response within 2 business days.   Please advise at Mobile [phone] (mobile)

## 2022-09-16 NOTE — Telephone Encounter (Signed)
     Patient  visit on 08/29/2022  at [facility] was for broken arm. Patient left without being seen before triage.   Have you been able to follow up with your primary care physician?   The patient was or was not able to obtain any needed medicine or equipment.  Are there diet recommendations that you are having difficulty following?  Patient expresses understanding of discharge instructions and education provided has no other needs at this time.    [place] Resource Care Guide   [email or web address]  ?? [phone]   Website: [email or web address]  [email or web address]

## 2022-09-16 NOTE — Telephone Encounter (Signed)
Medication sent.

## 2022-10-05 DIAGNOSIS — M79621 Pain in right upper arm: Secondary | ICD-10-CM | POA: Diagnosis not present

## 2022-10-10 DIAGNOSIS — S42201A Unspecified fracture of upper end of right humerus, initial encounter for closed fracture: Secondary | ICD-10-CM | POA: Diagnosis not present

## 2022-10-14 DIAGNOSIS — M79621 Pain in right upper arm: Secondary | ICD-10-CM | POA: Diagnosis not present

## 2023-01-03 ENCOUNTER — Encounter: Payer: Self-pay | Admitting: Family Medicine

## 2023-01-04 ENCOUNTER — Other Ambulatory Visit: Payer: Self-pay

## 2023-01-04 ENCOUNTER — Other Ambulatory Visit: Payer: Self-pay | Admitting: Family Medicine

## 2023-01-04 MED ORDER — ALBUTEROL SULFATE HFA 108 (90 BASE) MCG/ACT IN AERS: 2.0000 | INHALATION_SPRAY | Freq: Four times a day (QID) | RESPIRATORY_TRACT | 0 refills | Status: DC | PRN

## 2023-01-22 NOTE — Assessment & Plan Note (Signed)
Encourage heart healthy diet such as MIND or DASH diet, increase exercise, avoid trans fats, simple carbohydrates and processed foods, consider a krill or fish or flaxseed oil cap daily.  °

## 2023-01-22 NOTE — Assessment & Plan Note (Signed)
Avoid offending foods, start probiotics. Do not eat large meals in late evening and consider raising head of bed.  

## 2023-01-23 ENCOUNTER — Ambulatory Visit (INDEPENDENT_AMBULATORY_CARE_PROVIDER_SITE_OTHER): Payer: Medicare Other | Admitting: Family Medicine

## 2023-01-23 VITALS — BP 126/80 | HR 47 | Temp 97.5°F | Resp 16 | Ht 68.0 in | Wt 159.4 lb

## 2023-01-23 DIAGNOSIS — E782 Mixed hyperlipidemia: Secondary | ICD-10-CM | POA: Diagnosis not present

## 2023-01-23 DIAGNOSIS — K219 Gastro-esophageal reflux disease without esophagitis: Secondary | ICD-10-CM

## 2023-01-23 DIAGNOSIS — J45909 Unspecified asthma, uncomplicated: Secondary | ICD-10-CM | POA: Insufficient documentation

## 2023-01-23 DIAGNOSIS — J4520 Mild intermittent asthma, uncomplicated: Secondary | ICD-10-CM | POA: Diagnosis not present

## 2023-01-23 DIAGNOSIS — J309 Allergic rhinitis, unspecified: Secondary | ICD-10-CM

## 2023-01-23 DIAGNOSIS — Z8673 Personal history of transient ischemic attack (TIA), and cerebral infarction without residual deficits: Secondary | ICD-10-CM

## 2023-01-23 DIAGNOSIS — Z1211 Encounter for screening for malignant neoplasm of colon: Secondary | ICD-10-CM | POA: Diagnosis not present

## 2023-01-23 LAB — COMPREHENSIVE METABOLIC PANEL
ALT: 15 U/L (ref 0–53)
AST: 24 U/L (ref 0–37)
Albumin: 4.5 g/dL (ref 3.5–5.2)
Alkaline Phosphatase: 59 U/L (ref 39–117)
BUN: 17 mg/dL (ref 6–23)
CO2: 27 mEq/L (ref 19–32)
Calcium: 9.7 mg/dL (ref 8.4–10.5)
Chloride: 103 mEq/L (ref 96–112)
Creatinine, Ser: 0.92 mg/dL (ref 0.40–1.50)
GFR: 82.09 mL/min (ref >60.00)
Glucose, Bld: 90 mg/dL (ref 70–99)
Potassium: 4.1 mEq/L (ref 3.5–5.1)
Sodium: 139 mEq/L (ref 135–145)
Total Bilirubin: 0.7 mg/dL (ref 0.2–1.2)
Total Protein: 7.1 g/dL (ref 6.0–8.3)

## 2023-01-23 LAB — CBC WITH DIFFERENTIAL/PLATELET
Basophils Absolute: 0.1 10*3/uL (ref 0.0–0.1)
Basophils Relative: 1.3 % (ref 0.0–3.0)
Eosinophils Absolute: 0.2 10*3/uL (ref 0.0–0.7)
Eosinophils Relative: 3.2 % (ref 0.0–5.0)
HCT: 40.9 % (ref 39.0–52.0)
Hemoglobin: 13.7 g/dL (ref 13.0–17.0)
Lymphocytes Relative: 22 % (ref 12.0–46.0)
Lymphs Abs: 1.4 10*3/uL (ref 0.7–4.0)
MCHC: 33.4 g/dL (ref 30.0–36.0)
MCV: 92.9 fl (ref 78.0–100.0)
Monocytes Absolute: 0.5 10*3/uL (ref 0.1–1.0)
Monocytes Relative: 8.4 % (ref 3.0–12.0)
Neutro Abs: 4 10*3/uL (ref 1.4–7.7)
Neutrophils Relative %: 65.1 % (ref 43.0–77.0)
Platelets: 245 10*3/uL (ref 150.0–400.0)
RBC: 4.4 Mil/uL (ref 4.22–5.81)
RDW: 15.2 % (ref 11.5–15.5)
WBC: 6.2 10*3/uL (ref 4.0–10.5)

## 2023-01-23 LAB — LIPID PANEL
Cholesterol: 251 mg/dL — ABNORMAL HIGH (ref 0–200)
HDL: 81.5 mg/dL (ref >39.00)
LDL Cholesterol: 147 mg/dL — ABNORMAL HIGH (ref 0–99)
NonHDL: 169.25
Total CHOL/HDL Ratio: 3
Triglycerides: 113 mg/dL (ref 0.0–149.0)
VLDL: 22.6 mg/dL (ref 0.0–40.0)

## 2023-01-23 LAB — TSH: TSH: 4.43 u[IU]/mL (ref 0.35–5.50)

## 2023-01-23 NOTE — Assessment & Plan Note (Signed)
Cetirizine prn °

## 2023-01-23 NOTE — Patient Instructions (Signed)

## 2023-01-23 NOTE — Progress Notes (Signed)
Subjective:   By signing my name below, I, Barrett Shell, attest that this documentation has been prepared under the direction and in the presence of Bradd Canary, MD. 01/23/2023   Patient ID: Austin Ramirez., male    DOB: [birth year], 72 y.o.   MRN: [id number]  Chief Complaint  Patient presents with   Follow-up    Follow up    HPI Patient is in today for a follow-up appointment. He is accompanied by his wife during this visit.   Blood pressure His blood pressure is stable during this visit.  BP Readings from Last 3 Encounters:  01/23/23 126/80  07/22/22 124/78  01/17/22 129/75    Pulse Readings from Last 3 Encounters:  01/23/23 (!) 47  07/22/22 (!) 50  01/17/22 (!) 56    Asthma  He was prescribed albuterol for his asthma last month, but has not had to use it yet. He sneezes intermittently from allergies during time of year. He takes Zyrtec as needed to manage his allergies.   Cholesterol His cholesterol levels were elevated during his last blood work.  Lab Results  Component Value Date   CHOL 251 (H) 01/23/2023   HDL 81.50 01/23/2023   LDLCALC 147 (H) 01/23/2023   LDLDIRECT 144.0 12/08/2015   TRIG 113.0 01/23/2023   CHOLHDL 3 01/23/2023    Diet He is following a balanced diet. He drinks enough water daily. He exercises around 12 hours a week.    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  Family History  Problem Relation Age of Onset   Arthritis Mother     Heart disease Mother    Arthritis Father    Heart disease Father    Hypertension Father    ADD / ADHD Neg Hx     Social History   Socioeconomic History   Marital status: Married    Spouse name: Not on file   Number of children: Not on file   Years of education: Not on file   Highest education level: Some college, no degree  Occupational History   Occupation: Retired  Tobacco Use   Smoking status: Never   Smokeless tobacco: Never  Vaping Use   Vaping Use: Never used  Substance and Sexual Activity   Alcohol use: Yes    Alcohol/week: 0.0 standard drinks of alcohol   Drug use: No   Sexual activity: Yes    Comment: lives with wife, ultra marathon runner, 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,   Other Topics Concern   Not on file  Social History Narrative   Not on file   Social Determinants of Health   Financial Resource Strain: Low Risk  (01/20/2023)   Overall Financial Resource Strain (CARDIA)    Difficulty of Paying Living Expenses: Not hard at all  Food Insecurity: No Food Insecurity (01/20/2023)   Hunger Vital Sign    Worried About Programme researcher, broadcasting/film/video in  the Last Year: Never true    Ran Out of Food in the Last Year: Never true  Transportation Needs: No Transportation Needs (01/20/2023)   PRAPARE - Administrator, Civil Service (Medical): No    Lack of Transportation (Non-Medical): No  Physical Activity: Sufficiently Active (01/20/2023)   Exercise Vital Sign    Days of Exercise per Week: 4 days    Minutes of Exercise per Session: 120 min  Stress: No Stress Concern Present (01/20/2023)   [employer] of Occupational Health - Occupational Stress Questionnaire    Feeling of Stress : Not at all  Social Connections: Socially Integrated (01/20/2023)   Social Connection and Isolation Panel [NHANES]    Frequency of Communication with Friends and Family: More than three times a week    Frequency of Social Gatherings with Friends and Family: More than three times a  week    Attends Religious Services: More than 4 times per year    Active Member of [employer] or Organizations: Yes    Attends Engineer, structural: More than 4 times per year    Marital Status: Married  Catering manager Violence: Not At Risk (08/03/2022)   Humiliation, Afraid, Rape, and Kick questionnaire    Fear of Current or Ex-Partner: No    Emotionally Abused: No    Physically Abused: No    Sexually Abused: No    Outpatient Medications Prior to Visit  Medication Sig Dispense Refill   albuterol (VENTOLIN HFA) 108 (90 Base) MCG/ACT inhaler Inhale 2 puffs into the lungs every 6 (six) hours as needed for wheezing or shortness of breath. 18 g 0   b complex vitamins capsule Take 1 capsule by mouth daily.     clobetasol cream (TEMOVATE) 0.05 % Apply to effected area qd prn. 60 g 1   DHEA 10 MG CAPS Take by mouth daily.     Multiple Vitamin (MULTIVITAMIN) tablet Take 1 tablet by mouth daily.     multivitamin-lutein (OCUVITE-LUTEIN) CAPS capsule Take 1 capsule by mouth daily.     Omega-3 Fatty Acids (FISH OIL PO) Take by mouth 3 x daily with food.     No facility-administered medications prior to visit.    Allergies  Allergen Reactions   Almond Meal Swelling    ROS     Objective:    Physical Exam Constitutional:      General: He is not in acute distress.    Appearance: Normal appearance.  HENT:     Head: Normocephalic and atraumatic.     Right Ear: External ear normal.     Left Ear: External ear normal.  Eyes:     Extraocular Movements: Extraocular movements intact.     Pupils: Pupils are equal, round, and reactive to light.  Cardiovascular:     Rate and Rhythm: Normal rate and regular rhythm.     Heart sounds: Normal heart sounds. No murmur heard.    No gallop.  Skin:    General: Skin is warm.  Neurological:     Mental Status: He is alert and oriented to person, place, and time.  Psychiatric:        Judgment: Judgment normal.     BP 126/80 (BP Location:  Right Arm, Patient Position: Sitting, Cuff Size: Normal)   Pulse (!) 47   Temp (!) 97.5 F (36.4 C) (Oral)   Resp 16   Ht 5\' 8"  (1.727 m)   Wt 159 lb 6.4 oz (72.3 kg)   SpO2 96%  BMI 24.24 kg/m  Wt Readings from Last 3 Encounters:  01/23/23 159 lb 6.4 oz (72.3 kg)  07/22/22 158 lb 3.2 oz (71.8 kg)  01/17/22 160 lb 3.2 oz (72.7 kg)       Assessment & Plan:  Hyperlipidemia, mixed Assessment & Plan: Encourage heart healthy diet such as MIND or DASH diet, increase exercise, avoid trans fats, simple carbohydrates and processed foods, consider a krill or fish or flaxseed oil cap daily.    Orders: -     Comprehensive metabolic panel -     Lipid panel -     TSH  Gastroesophageal reflux disease, unspecified whether esophagitis present Assessment & Plan: Avoid offending foods, start probiotics. Do not eat large meals in late evening and consider raising head of bed.    Orders: -     CBC with Differential/Platelet -     TSH  Mild intermittent asthma without complication Assessment & Plan: Since childhood, has had very little trouble the last few years but this year with the increased pollen count so his wheezing restarted. Was given Albuterol but he has not needed to use it.    Allergic rhinitis, unspecified seasonality, unspecified trigger Assessment & Plan: Cetirizine prn.    H/O: stroke -     TSH  Colon cancer screening Assessment & Plan: [place]. They will bring in a copy of the report and he is referred for next colonoscopy  Orders: -     Ambulatory referral to Gastroenterology    I, Danise Edge, MD, personally preformed the services described in this documentation.  All medical record entries made by the scribe were at my direction and in my presence.  I have reviewed the chart and discharge instructions (if applicable) and agree that the record reflects my personal performance and is accurate and complete.  01/23/2023  Danise Edge, MD   [facility] as a scribe for Danise Edge, MD.,have documented all relevant documentation on the behalf of Danise Edge, MD,as directed by  Danise Edge, MD while in the presence of Danise Edge, MD.

## 2023-01-23 NOTE — Assessment & Plan Note (Signed)
He [place] his last colonoscopy [place] over 9 [place] ago. They will bring in a copy of the report and he is referred for next colonoscopy

## 2023-01-23 NOTE — Assessment & Plan Note (Signed)
Since childhood, has had very little trouble the last few years but this year with the increased pollen count so his wheezing restarted. Was given Albuterol but he has not needed to use it.

## 2023-01-31 DIAGNOSIS — M5381 Other specified dorsopathies, occipito-atlanto-axial region: Secondary | ICD-10-CM | POA: Diagnosis not present

## 2023-01-31 DIAGNOSIS — M9902 Segmental and somatic dysfunction of thoracic region: Secondary | ICD-10-CM | POA: Diagnosis not present

## 2023-01-31 DIAGNOSIS — M9901 Segmental and somatic dysfunction of cervical region: Secondary | ICD-10-CM | POA: Diagnosis not present

## 2023-01-31 DIAGNOSIS — H814 Vertigo of central origin: Secondary | ICD-10-CM | POA: Diagnosis not present

## 2023-02-03 DIAGNOSIS — H2513 Age-related nuclear cataract, bilateral: Secondary | ICD-10-CM | POA: Diagnosis not present

## 2023-02-03 DIAGNOSIS — H43812 Vitreous degeneration, left eye: Secondary | ICD-10-CM | POA: Diagnosis not present

## 2023-02-03 DIAGNOSIS — H40003 Preglaucoma, unspecified, bilateral: Secondary | ICD-10-CM | POA: Diagnosis not present

## 2023-02-14 DIAGNOSIS — M9902 Segmental and somatic dysfunction of thoracic region: Secondary | ICD-10-CM | POA: Diagnosis not present

## 2023-02-14 DIAGNOSIS — M5381 Other specified dorsopathies, occipito-atlanto-axial region: Secondary | ICD-10-CM | POA: Diagnosis not present

## 2023-02-14 DIAGNOSIS — M9901 Segmental and somatic dysfunction of cervical region: Secondary | ICD-10-CM | POA: Diagnosis not present

## 2023-02-14 DIAGNOSIS — H814 Vertigo of central origin: Secondary | ICD-10-CM | POA: Diagnosis not present

## 2023-02-27 DIAGNOSIS — H40003 Preglaucoma, unspecified, bilateral: Secondary | ICD-10-CM | POA: Diagnosis not present

## 2023-02-28 DIAGNOSIS — H814 Vertigo of central origin: Secondary | ICD-10-CM | POA: Diagnosis not present

## 2023-02-28 DIAGNOSIS — M5381 Other specified dorsopathies, occipito-atlanto-axial region: Secondary | ICD-10-CM | POA: Diagnosis not present

## 2023-02-28 DIAGNOSIS — M9901 Segmental and somatic dysfunction of cervical region: Secondary | ICD-10-CM | POA: Diagnosis not present

## 2023-02-28 DIAGNOSIS — M9902 Segmental and somatic dysfunction of thoracic region: Secondary | ICD-10-CM | POA: Diagnosis not present

## 2023-03-02 DIAGNOSIS — H40053 Ocular hypertension, bilateral: Secondary | ICD-10-CM | POA: Diagnosis not present

## 2023-03-02 DIAGNOSIS — H43812 Vitreous degeneration, left eye: Secondary | ICD-10-CM | POA: Diagnosis not present

## 2023-06-05 DIAGNOSIS — H40053 Ocular hypertension, bilateral: Secondary | ICD-10-CM | POA: Diagnosis not present

## 2023-06-05 DIAGNOSIS — H2513 Age-related nuclear cataract, bilateral: Secondary | ICD-10-CM | POA: Diagnosis not present

## 2023-07-23 NOTE — Assessment & Plan Note (Signed)
Avoid offending foods, start probiotics. Do not eat large meals in late evening and consider raising head of bed.  

## 2023-07-23 NOTE — Assessment & Plan Note (Signed)
He had his last colonoscopy [place] over 9 [place] ago. They will bring in a copy of the report and he is referred for next colonoscopy. Patient encouraged to maintain heart healthy diet, regular exercise, adequate sleep. Consider daily probiotics. Take medications as prescribed Labs ordered and reviewed. Given and reviewed copy of ACP documents from [employer] and encouraged to complete and return

## 2023-07-23 NOTE — Assessment & Plan Note (Signed)
Hydrate and monitor 

## 2023-07-23 NOTE — Assessment & Plan Note (Signed)
Encourage heart healthy diet such as MIND or DASH diet, increase exercise, avoid trans fats, simple carbohydrates and processed foods, consider a krill or fish or flaxseed oil cap daily.  °

## 2023-07-24 ENCOUNTER — Ambulatory Visit (INDEPENDENT_AMBULATORY_CARE_PROVIDER_SITE_OTHER): Payer: Medicare Other | Admitting: Family Medicine

## 2023-07-24 ENCOUNTER — Encounter: Payer: Self-pay | Admitting: Family Medicine

## 2023-07-24 ENCOUNTER — Telehealth: Payer: Self-pay | Admitting: *Deleted

## 2023-07-24 VITALS — BP 138/88 | HR 65 | Temp 97.4°F | Resp 12 | Ht 67.0 in | Wt 156.0 lb

## 2023-07-24 DIAGNOSIS — E782 Mixed hyperlipidemia: Secondary | ICD-10-CM

## 2023-07-24 DIAGNOSIS — R351 Nocturia: Secondary | ICD-10-CM

## 2023-07-24 DIAGNOSIS — K219 Gastro-esophageal reflux disease without esophagitis: Secondary | ICD-10-CM

## 2023-07-24 DIAGNOSIS — Z1211 Encounter for screening for malignant neoplasm of colon: Secondary | ICD-10-CM

## 2023-07-24 DIAGNOSIS — Z0000 Encounter for general adult medical examination without abnormal findings: Secondary | ICD-10-CM

## 2023-07-24 LAB — CBC WITH DIFFERENTIAL/PLATELET
Basophils Absolute: 0.1 10*3/uL (ref 0.0–0.1)
Basophils Relative: 1.1 % (ref 0.0–3.0)
Eosinophils Absolute: 0.3 10*3/uL (ref 0.0–0.7)
Eosinophils Relative: 5.2 % — ABNORMAL HIGH (ref 0.0–5.0)
HCT: 39.9 % (ref 39.0–52.0)
Hemoglobin: 13 g/dL (ref 13.0–17.0)
Lymphocytes Relative: 16.7 % (ref 12.0–46.0)
Lymphs Abs: 1 10*3/uL (ref 0.7–4.0)
MCHC: 32.5 g/dL (ref 30.0–36.0)
MCV: 95 fL (ref 78.0–100.0)
Monocytes Absolute: 0.4 10*3/uL (ref 0.1–1.0)
Monocytes Relative: 7.7 % (ref 3.0–12.0)
Neutro Abs: 4 10*3/uL (ref 1.4–7.7)
Neutrophils Relative %: 69.3 % (ref 43.0–77.0)
Platelets: 230 10*3/uL (ref 150.0–400.0)
RBC: 4.2 Mil/uL — ABNORMAL LOW (ref 4.22–5.81)
RDW: 15.6 % — ABNORMAL HIGH (ref 11.5–15.5)
WBC: 5.8 10*3/uL (ref 4.0–10.5)

## 2023-07-24 LAB — COMPREHENSIVE METABOLIC PANEL
ALT: 14 U/L (ref 0–53)
AST: 24 U/L (ref 0–37)
Albumin: 4.4 g/dL (ref 3.5–5.2)
Alkaline Phosphatase: 64 U/L (ref 39–117)
BUN: 22 mg/dL (ref 6–23)
CO2: 29 meq/L (ref 19–32)
Calcium: 9.5 mg/dL (ref 8.4–10.5)
Chloride: 105 meq/L (ref 96–112)
Creatinine, Ser: 0.91 mg/dL (ref 0.40–1.50)
GFR: 82.89 mL/min (ref >60.00)
Glucose, Bld: 81 mg/dL (ref 70–99)
Potassium: 4.1 meq/L (ref 3.5–5.1)
Sodium: 140 meq/L (ref 135–145)
Total Bilirubin: 0.5 mg/dL (ref 0.2–1.2)
Total Protein: 6.7 g/dL (ref 6.0–8.3)

## 2023-07-24 LAB — LIPID PANEL
Cholesterol: 242 mg/dL — ABNORMAL HIGH (ref 0–200)
HDL: 72.6 mg/dL (ref >39.00)
LDL Cholesterol: 124 mg/dL — ABNORMAL HIGH (ref 0–99)
NonHDL: 169.58
Total CHOL/HDL Ratio: 3
Triglycerides: 227 mg/dL — ABNORMAL HIGH (ref 0.0–149.0)
VLDL: 45.4 mg/dL — ABNORMAL HIGH (ref 0.0–40.0)

## 2023-07-24 LAB — TSH: TSH: 3.27 u[IU]/mL (ref 0.35–5.50)

## 2023-07-24 LAB — PSA: PSA: 1.21 ng/mL (ref 0.10–4.00)

## 2023-07-24 NOTE — Telephone Encounter (Signed)
Left message on machine to see if patient could come in early, between 12-1220pm

## 2023-07-24 NOTE — Patient Instructions (Addendum)
Netflix Live to 100 the secrets of the [employer]  Tetanus due 2026 or sooner if injured Flu and COVID boosters annually in fall Shingrix is the new shingles shot, 2 shots over 2-6 months, at pharmacy Prevnar 20 once at pharmacy RSV, Respiratory Syncitial Virus Vaccine, Arexvy at pharmacy  Preventive Care 53 Years and Older, Male Preventive care refers to lifestyle choices and visits with your health care provider that can promote health and wellness. Preventive care visits are also called wellness exams. What can I expect for my preventive care visit? Counseling During your preventive care visit, your health care provider may ask about your: Medical history, including: Past medical problems. Family medical history. History of falls. Current health, including: Emotional well-being. Home life and relationship well-being. Sexual activity. Memory and ability to understand (cognition). Lifestyle, including: Alcohol, nicotine or tobacco, and drug use. Access to firearms. Diet, exercise, and sleep habits. Work and work Astronomer. Sunscreen use. Safety issues such as seatbelt and bike helmet use. Physical exam Your health care provider will check your: Height and weight. These may be used to calculate your BMI (body mass index). BMI is a measurement that tells if you are at a healthy weight. Waist circumference. This measures the distance around your waistline. This measurement also tells if you are at a healthy weight and may help predict your risk of certain diseases, such as type 2 diabetes and high blood pressure. Heart rate and blood pressure. Body temperature. Skin for abnormal spots. What immunizations do I need?  Vaccines are usually given at various ages, according to a schedule. Your health care provider will recommend vaccines for you based on your age, medical history, and lifestyle or other factors, such as travel or where you work. What tests do I  need? Screening Your health care provider may recommend screening tests for certain conditions. This may include: Lipid and cholesterol levels. Diabetes screening. This is done by checking your blood sugar (glucose) after you have not eaten for a while (fasting). Hepatitis C test. Hepatitis B test. HIV (human immunodeficiency virus) test. STI (sexually transmitted infection) testing, if you are at risk. Lung cancer screening. Colorectal cancer screening. Prostate cancer screening. Abdominal aortic aneurysm (AAA) screening. You may need this if you are a current or former smoker. Talk with your health care provider about your test results, treatment options, and if necessary, the need for more tests. Follow these instructions at home: Eating and drinking  Eat a diet that includes fresh fruits and vegetables, whole grains, lean protein, and low-fat dairy products. Limit your intake of foods with high amounts of sugar, saturated fats, and salt. Take vitamin and mineral supplements as recommended by your health care provider. Do not drink alcohol if your health care provider tells you not to drink. If you drink alcohol: Limit how much you have to 0-2 drinks a day. Know how much alcohol is in your drink. In the [place] one drink equals one 12 oz bottle of beer (355 mL), one 5 oz glass of wine (148 mL), or one 1 oz glass of hard liquor (44 mL). Lifestyle Brush your teeth every morning and night with fluoride toothpaste. Floss one time each day. Exercise for at least 30 minutes 5 or more days each week. Do not use any products that contain nicotine or tobacco. These products include cigarettes, chewing tobacco, and vaping devices, such as e-cigarettes. If you need help quitting, ask your health care provider. Do not use drugs. If you are sexually  active, practice safe sex. Use a condom or other form of protection to prevent STIs. Take aspirin only as told by your health care provider. Make sure  that you understand how much to take and what form to take. Work with your health care provider to find out whether it is safe and beneficial for you to take aspirin daily. Ask your health care provider if you need to take a cholesterol-lowering medicine (statin). Find healthy ways to manage stress, such as: Meditation, yoga, or listening to music. Journaling. Talking to a trusted person. Spending time with friends and family. Safety Always wear your seat belt while driving or riding in a vehicle. Do not drive: If you have been drinking alcohol. Do not ride with someone who has been drinking. When you are tired or distracted. While texting. If you have been using any mind-altering substances or drugs. Wear a helmet and other protective equipment during sports activities. If you have firearms in your house, make sure you follow all gun safety procedures. Minimize exposure to UV radiation to reduce your risk of skin cancer. What's next? Visit your health care provider once a year for an annual wellness visit. Ask your health care provider how often you should have your eyes and teeth checked. Stay up to date on all vaccines. This information is not intended to replace advice given to you by your health care provider. Make sure you discuss any questions you have with your health care provider. Document Revised: 03/10/2021 Document Reviewed: 03/10/2021 Elsevier Patient Education  2024 [employer].

## 2023-07-24 NOTE — Progress Notes (Unsigned)
Subjective:    Patient ID: Austin Ramirez., male    DOB: [birth year], 72 y.o.   MRN: [id number]  No chief complaint on file.   HPI Discussed the use of AI scribe software for clinical note transcription with the patient, who gave verbal consent to proceed.  History of Present Illness   The patient, a 72 year old with a history of stroke and COVID-19, presents for a routine check-up before embarking on a three-week vacation. The patient maintains an active lifestyle and adheres to a clean diet, which includes avoiding processed foods. The patient reports regular bowel movements and no recent illnesses. The patient had a stroke five years ago but has since recovered well and remains active. The patient contracted COVID-19 a year and a half ago, experiencing mild symptoms for two days before returning to normal activities. The patient expresses hesitancy about receiving certain vaccinations, including the flu and COVID-19 booster shots, due to concerns about potential side effects. However, the patient is open to considering these vaccinations in the future, particularly before travel. The patient has completed advanced directives and has a healthcare power of attorney.        Past Medical History:  Diagnosis Date  . Allergic state 12/08/2015  . Allergy   . BCC (basal cell carcinoma of skin) 12/08/2015  . Cancer (HCC)    skin cancer  . Dermatitis 10/11/2016  . Dysphagia 12/08/2015  . Esophageal reflux 12/08/2015  . Frequent headaches   . GERD (gastroesophageal reflux disease)   . Heart murmur   . Hyperlipidemia   . Hyperlipidemia, mixed 12/08/2015  . Low back pain 12/08/2015  . Nocturia 04/11/2017  . Preventative health care 12/08/2015  . Rib pain 12/08/2015  . 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 No pertinent surgical history.  Family History  Problem Relation Age of Onset  . Arthritis Mother   . Heart disease Mother   . Arthritis Father   . Heart disease Father    . Hypertension Father   . ADD / ADHD Neg Hx     Social History   Socioeconomic History  . Marital status: Married    Spouse name: Not on file  . Number of children: Not on file  . Years of education: Not on file  . Highest education level: Some college, no degree  Occupational History  . Occupation: Retired  Tobacco Use  . Smoking status: Never  . Smokeless tobacco: Never  Vaping Use  . Vaping status: Never Used  Substance and Sexual Activity  . Alcohol use: Yes    Alcohol/week: 0.0 standard drinks of alcohol  . Drug use: No  . 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 Not on file  Social History Narrative  . Not on file   Social Determinants of Health   Financial Resource Strain: Low Risk  (01/20/2023)   Overall Financial Resource Strain (CARDIA)   . Difficulty of Paying Living Expenses: Not hard at all  Food Insecurity: No Food Insecurity (01/20/2023)   Hunger Vital Sign   . Worried About Programme researcher, broadcasting/film/video in the Last Year: Never true   . Ran Out of Food in the Last Year: Never true  Transportation Needs: No Transportation Needs (01/20/2023)   PRAPARE - Transportation   . Lack of Transportation (Medical): No   . Lack of Transportation (Non-Medical): No  Physical Activity: Sufficiently Active (01/20/2023)  Exercise Vital Sign   . Days of Exercise per Week: 4 days   . Minutes of Exercise per Session: 120 min  Stress: No Stress Concern Present (01/20/2023)   [employer] of Occupational Health - Occupational Stress Questionnaire   . Feeling of Stress : Not at all  Social Connections: Socially Integrated (01/20/2023)   Social Connection and Isolation Panel [NHANES]   . Frequency of Communication with Friends and Family: More than three times a week   . Frequency of Social Gatherings with Friends and Family: More than three times a week   . Attends Religious Services: More  than 4 times per year   . Active Member of Clubs or Organizations: Yes   . Attends Banker Meetings: More than 4 times per year   . Marital Status: Married  Catering manager Violence: Not At Risk (09/09/2022)   [facility]   . Threatened: Not on file   . Insulted: Not on file   . Physically Hurt : Not on file   . Scream: Not on file    Outpatient Medications Prior to Visit  Medication Sig Dispense Refill  . albuterol (VENTOLIN HFA) 108 (90 Base) MCG/ACT inhaler Inhale 2 puffs into the lungs every 6 (six) hours as needed for wheezing or shortness of breath. 18 g 0  . b complex vitamins capsule Take 1 capsule by mouth daily.    . clobetasol cream (TEMOVATE) 0.05 % Apply to effected area qd prn. 60 g 1  . DHEA 10 MG CAPS Take by mouth daily.    . Multiple Vitamin (MULTIVITAMIN) tablet Take 1 tablet by mouth daily.    . multivitamin-lutein (OCUVITE-LUTEIN) CAPS capsule Take 1 capsule by mouth daily.    . Omega-3 Fatty Acids (FISH OIL PO) Take by mouth 3 x daily with food.     No facility-administered medications prior to visit.    Allergies  Allergen Reactions  . Almond Mea Swelling    Review of Systems  Constitutional:  Negative for fever and malaise/fatigue.  HENT:  Negative for congestion.   Eyes:  Negative for blurred vision.  Respiratory:  Negative for shortness of breath.   Cardiovascular:  Negative for chest pain, palpitations and leg swelling.  Gastrointestinal:  Negative for abdominal pain, blood in stool and nausea.  Genitourinary:  Negative for dysuria and frequency.  Musculoskeletal:  Negative for falls.  Skin:  Negative for rash.  Neurological:  Negative for dizziness, loss of consciousness and headaches.  Endo/Heme/Allergies:  Negative for environmental allergies.  Psychiatric/Behavioral:  Negative for depression. The patient is not nervous/anxious.       Objective:    Physical Exam Vitals reviewed.   Constitutional:      Appearance: Normal appearance. He is not ill-appearing.  HENT:     Head: Normocephalic and atraumatic.     Nose: Nose normal.  Eyes:     Conjunctiva/sclera: Conjunctivae normal.  Cardiovascular:     Rate and Rhythm: Normal rate.     Pulses: Normal pulses.     Heart sounds: Normal heart sounds. No murmur heard. Pulmonary:     Effort: Pulmonary effort is normal.     Breath sounds: Normal breath sounds. No wheezing.  Abdominal:     Palpations: Abdomen is soft. There is no mass.     Tenderness: There is no abdominal tenderness.  Musculoskeletal:     Cervical back: Normal range of motion.     Right lower leg: No  edema.     Left lower leg: No edema.  Skin:    General: Skin is warm and dry.  Neurological:     General: No focal deficit present.     Mental Status: He is alert and oriented to person, place, and time.  Psychiatric:        Mood and Affect: Mood normal.   BP 138/88 (BP Location: Left Arm, Cuff Size: Normal)   Pulse 65   Temp (!) 97.4 F (36.3 C) (Oral)   Resp 12   Ht 5\' 7"  (1.702 m)   Wt 156 lb (70.8 kg)   SpO2 97%   BMI 24.43 kg/m  Wt Readings from Last 3 Encounters:  07/24/23 156 lb (70.8 kg)  01/23/23 159 lb 6.4 oz (72.3 kg)  07/22/22 158 lb 3.2 oz (71.8 kg)    Diabetic Foot Exam - Simple   No data filed    Lab Results  Component Value Date   WBC 5.8 07/24/2023   HGB 13.0 07/24/2023   HCT 39.9 07/24/2023   PLT 230.0 07/24/2023   GLUCOSE 81 07/24/2023   CHOL 242 (H) 07/24/2023   TRIG 227.0 (H) 07/24/2023   HDL 72.60 07/24/2023   LDLDIRECT 144.0 12/08/2015   LDLCALC 124 (H) 07/24/2023   ALT 14 07/24/2023   AST 24 07/24/2023   NA 140 07/24/2023   K 4.1 07/24/2023   CL 105 07/24/2023   CREATININE 0.91 07/24/2023   BUN 22 07/24/2023   CO2 29 07/24/2023   TSH 3.27 07/24/2023   PSA 1.21 07/24/2023    Lab Results  Component Value Date   TSH 3.27 07/24/2023   Lab Results  Component Value Date   WBC 5.8 07/24/2023    HGB 13.0 07/24/2023   HCT 39.9 07/24/2023   MCV 95.0 07/24/2023   PLT 230.0 07/24/2023   Lab Results  Component Value Date   NA 140 07/24/2023   K 4.1 07/24/2023   CO2 29 07/24/2023   GLUCOSE 81 07/24/2023   BUN 22 07/24/2023   CREATININE 0.91 07/24/2023   BILITOT 0.5 07/24/2023   ALKPHOS 64 07/24/2023   AST 24 07/24/2023   ALT 14 07/24/2023   PROT 6.7 07/24/2023   ALBUMIN 4.4 07/24/2023   CALCIUM 9.5 07/24/2023   GFR 82.89 07/24/2023   Lab Results  Component Value Date   CHOL 242 (H) 07/24/2023   Lab Results  Component Value Date   HDL 72.60 07/24/2023   Lab Results  Component Value Date   LDLCALC 124 (H) 07/24/2023   Lab Results  Component Value Date   TRIG 227.0 (H) 07/24/2023   Lab Results  Component Value Date   CHOLHDL 3 07/24/2023   No results found for: "HGBA1C"     Assessment & Plan:  Hyperlipidemia, mixed Assessment & Plan: Encourage heart healthy diet such as MIND or DASH diet, increase exercise, avoid trans fats, simple carbohydrates and processed foods, consider a krill or fish or flaxseed oil cap daily.    Orders: -     Comprehensive metabolic panel -     Lipid panel -     TSH  Nocturia Assessment & Plan: Hydrate and monitor   Orders: -     PSA  Gastroesophageal reflux disease, unspecified whether esophagitis present Assessment & Plan: Avoid offending foods, start probiotics. Do not eat large meals in late evening and consider raising head of bed.    Orders: -     CBC with Differential/Platelet -  TSH  Preventative health care Assessment & Plan: [place]. Cologuard ordered.  Patient encouraged to maintain heart healthy diet, regular exercise, adequate sleep. Consider daily probiotics. Take medications as prescribed Labs ordered and reviewed. Given and reviewed copy of ACP documents from [employer] and encouraged to complete and return   Orders: -     TSH  Colon  cancer screening -     Cologuard    Assessment and Plan    Hypertension Elevated blood pressure noted during visit, likely due to stress related to upcoming travel. Patient maintains an active lifestyle and adheres to a healthy diet. -Continue current management strategies including regular exercise and a healthy diet. -Check blood pressure at next visit.  Prostate Health It has been two years since the last PSA check. -Order PSA test today.  Colon Health Patient has had previous colonoscopies with no polyps found and no family history of colon cancer. Patient prefers less invasive screening methods. -Order Hologuard home test for colon cancer screening.  Vaccinations Patient has not received flu or COVID booster shots and is hesitant about receiving shingles and pneumonia vaccines. -Discuss benefits of vaccinations at next visit, particularly before travel.  Advanced Care Planning Patient has a durable power of attorney but no advanced care planning (ACP) documented in the chart. -Request copies of durable power of attorney and living will for chart. -Discuss importance of ACP at next visit.  Follow-up -Schedule follow-up visit in 6 months with the option to cancel if patient is doing well. -Schedule comprehensive physical exam in 12 months.         Danise Edge, MD

## 2023-08-29 DIAGNOSIS — Z1211 Encounter for screening for malignant neoplasm of colon: Secondary | ICD-10-CM | POA: Diagnosis not present

## 2023-09-02 LAB — COLOGUARD: COLOGUARD: NEGATIVE

## 2023-10-02 ENCOUNTER — Telehealth: Payer: Self-pay | Admitting: Family Medicine

## 2023-10-02 NOTE — Telephone Encounter (Signed)
 Copied from CRM [id number]. Topic: Medicare AWV >> Oct 02, 2023  2:14 PM Nathanel DEL wrote: Reason for CRM: Called LVM 10/02/2023 to schedule AWV. Please schedule Virtual or Telehealth visits ONLY  Nathanel Paschal; Care Guide Ambulatory Clinical Support [place] [facility] Direct Dial: [phone]

## 2023-10-02 NOTE — Telephone Encounter (Signed)
 Copied from CRM [id number]. Topic: Medicare AWV >> Oct 02, 2023  2:15 PM Nathanel DEL wrote: Reason for CRM: Called LVM 10/02/2023 to schedule AWV. Please schedule Virtual or Telehealth visits ONLY  Nathanel Paschal; Care Guide Ambulatory Clinical Support [place] [facility] Direct Dial: [phone]

## 2023-10-09 DIAGNOSIS — H40053 Ocular hypertension, bilateral: Secondary | ICD-10-CM | POA: Diagnosis not present

## 2023-10-09 DIAGNOSIS — H2513 Age-related nuclear cataract, bilateral: Secondary | ICD-10-CM | POA: Diagnosis not present

## 2023-11-20 DIAGNOSIS — C44519 Basal cell carcinoma of skin of other part of trunk: Secondary | ICD-10-CM | POA: Diagnosis not present

## 2023-11-20 DIAGNOSIS — L570 Actinic keratosis: Secondary | ICD-10-CM | POA: Diagnosis not present

## 2023-11-20 DIAGNOSIS — D485 Neoplasm of uncertain behavior of skin: Secondary | ICD-10-CM | POA: Diagnosis not present

## 2023-12-18 ENCOUNTER — Ambulatory Visit: Payer: Self-pay | Admitting: Family Medicine

## 2023-12-18 ENCOUNTER — Ambulatory Visit: Admitting: Physician Assistant

## 2023-12-18 ENCOUNTER — Encounter: Payer: Self-pay | Admitting: Physician Assistant

## 2023-12-18 VITALS — BP 144/90 | HR 51 | Ht 67.0 in | Wt 161.6 lb

## 2023-12-18 DIAGNOSIS — R310 Gross hematuria: Secondary | ICD-10-CM | POA: Diagnosis not present

## 2023-12-18 LAB — POCT URINALYSIS DIP (MANUAL ENTRY)
Bilirubin, UA: NEGATIVE
Glucose, UA: NEGATIVE mg/dL
Nitrite, UA: NEGATIVE
Protein Ur, POC: NEGATIVE mg/dL
Spec Grav, UA: 1.01 (ref 1.010–1.025)
Urobilinogen, UA: 0.2 U/dL
pH, UA: 6 (ref 5.0–8.0)

## 2023-12-18 LAB — URINALYSIS, MICROSCOPIC ONLY

## 2023-12-18 NOTE — Progress Notes (Signed)
 Established patient visit   Patient: Austin Ramirez.   DOB: 12/11/1950   73 y.o. Male  MRN: [id number] Visit Date: 12/18/2023  Today's healthcare provider: Alfredia Ferguson, PA-C   Cc hematuria  Subjective     Pt reports seeing a blood clot in his urine this morning. Denies changes in urinary habits, dysuria, increase in frequency, lower abdominal pain, fevers.  Medications: Outpatient Medications Prior to Visit  Medication Sig   albuterol (VENTOLIN HFA) 108 (90 Base) MCG/ACT inhaler Inhale 2 puffs into the lungs every 6 (six) hours as needed for wheezing or shortness of breath.   b complex vitamins capsule Take 1 capsule by mouth daily.   clobetasol cream (TEMOVATE) 0.05 % Apply to effected area qd prn.   DHEA 10 MG CAPS Take by mouth daily.   Multiple Vitamin (MULTIVITAMIN) tablet Take 1 tablet by mouth daily.   multivitamin-lutein (OCUVITE-LUTEIN) CAPS capsule Take 1 capsule by mouth daily.   Omega-3 Fatty Acids (FISH OIL PO) Take by mouth 3 x daily with food.   No facility-administered medications prior to visit.    Review of Systems  Constitutional:  Negative for fatigue and fever.  Respiratory:  Negative for cough and shortness of breath.   Cardiovascular:  Negative for chest pain, palpitations and leg swelling.  Genitourinary:  Positive for hematuria.  Neurological:  Negative for dizziness and headaches.       Objective    BP (!) 144/90   Pulse (!) 51   Ht 5\' 7"  (1.702 m)   Wt 161 lb 9.6 oz (73.3 kg)   BMI 25.31 kg/m    Physical Exam Constitutional:      General: He is awake.     Appearance: He is well-developed.  HENT:     Head: Normocephalic.  Eyes:     Conjunctiva/sclera: Conjunctivae normal.  Cardiovascular:     Rate and Rhythm: Normal rate and regular rhythm.     Heart sounds: Normal heart sounds.  Pulmonary:     Effort: Pulmonary effort is normal.     Breath sounds: Normal breath sounds.  Skin:    General: Skin is warm.   Neurological:     Mental Status: He is alert and oriented to person, place, and time.  Psychiatric:        Attention and Perception: Attention normal.        Mood and Affect: Mood normal.        Speech: Speech normal.        Behavior: Behavior is cooperative.      Results for orders placed or performed in visit on 12/18/23  POCT urinalysis dipstick  Result Value Ref Range   Color, UA yellow yellow   Clarity, UA clear clear   Glucose, UA negative negative mg/dL   Bilirubin, UA negative negative   Ketones, POC UA small (15) (A) negative mg/dL   Spec Grav, UA 4.235 3.614 - 1.025   Blood, UA moderate (A) negative   pH, UA 6.0 5.0 - 8.0   Protein Ur, POC negative negative mg/dL   Urobilinogen, UA 0.2 0.2 or 1.0 E.U./dL   Nitrite, UA Negative Negative   Leukocytes, UA Trace (A) Negative    Assessment & Plan    Hematuria, gross -     POCT urinalysis dipstick -     Ambulatory referral to Urology -     Urine Culture -     Urine Microscopic  UA today w/ blood, trace leuks Ordering  urine culture and urine micro  Referring to uro per family preference, but advise a bladder infection can explain symptoms-- would treat and re-test  Return if symptoms worsen or fail to improve.      Alfredia Ferguson, PA-C  [facility] [phone] (phone) [phone] (fax)  [facility]

## 2023-12-18 NOTE — Telephone Encounter (Signed)
 Copied from CRM [id number]. Topic: Clinical - Red Word Triage >> Dec 18, 2023  8:51 AM Almira Coaster wrote: Red Word that prompted transfer to Nurse Triage: Patient experiencing blood in the urine. Noticed this morning.  Chief Complaint: Blood in urine Symptoms: Urinary frequency Frequency: Today Pertinent Negatives: Patient denies fever Disposition: [] ED /[] Urgent Care (no appt availability in office) / [x] Appointment(In office/virtual)/ []  [place] [facility]/ [] Home Care/ [] Refused Recommended Disposition /[] [place] [facility]/ []  Follow-up with PCP Additional Notes: Patient's wife called to report that her husband saw a streak of blood in his urine today. Patient was also present on the call. Patient has seen blood one time today. Patient is experiencing urinary frequency. Patient denied fever, abdominal pain and vomiting. Patient experiences lower back pain at baseline, so was unable to report a correlation. This RN advised patient to see a provider within 24 hours, per protocol. No availability with PCP. This RN scheduled same day appointment with alternate provider in office. This RN advised patient to call back if symptoms worsen. Patient complied.   Reason for Disposition  Blood in urine  (Exception: Could be normal menstrual bleeding.)  Answer Assessment - Initial Assessment Questions 1. COLOR of URINE: "Describe the color of the urine."  (e.g., tea-colored, pink, red, bloody) "Do you have blood clots in your urine?" (e.g., none, pea, grape, small coin)     States there was streak of blood in his urine, states urine was still yellow and not tinted pink 2. ONSET: "When did the bleeding start?"      Today 3. EPISODES: "How many times has there been blood in the urine?" or "How many times today?"     One time today 4. PAIN with URINATION: "Is there any pain with passing your urine?" If Yes, ask: "How bad is the pain?"  (Scale 1-10; or mild, moderate, severe)    - MILD: Complains slightly  about urination hurting.    - MODERATE: Interferes with normal activities.      - SEVERE: Excruciating, unwilling or unable to urinate because of the pain.      Denies pain 5. FEVER: "Do you have a fever?" If Yes, ask: "What is your temperature, how was it measured, and when did it start?"     Denies 6. ASSOCIATED SYMPTOMS: "Are you passing urine more frequently than usual?"     States patient is using bathroom more frequently than usual  7. OTHER SYMPTOMS: "Do you have any other symptoms?" (e.g., back/flank pain, abdomen pain, vomiting)     Lower back pain at baseline, denies abdominal pain, denies vomiting  Protocols used: Urine - Blood In-A-AH

## 2023-12-19 LAB — URINE CULTURE
MICRO NUMBER:: 16238353
Result:: NO GROWTH
SPECIMEN QUALITY:: ADEQUATE

## 2023-12-20 ENCOUNTER — Encounter: Payer: Self-pay | Admitting: Physician Assistant

## 2023-12-25 ENCOUNTER — Ambulatory Visit (INDEPENDENT_AMBULATORY_CARE_PROVIDER_SITE_OTHER): Admitting: Internal Medicine

## 2023-12-25 ENCOUNTER — Ambulatory Visit: Payer: Self-pay

## 2023-12-25 VITALS — BP 116/76 | HR 57 | Temp 97.6°F | Resp 12 | Ht 67.0 in | Wt 161.0 lb

## 2023-12-25 DIAGNOSIS — R310 Gross hematuria: Secondary | ICD-10-CM

## 2023-12-25 LAB — URINALYSIS, ROUTINE W REFLEX MICROSCOPIC
Bilirubin Urine: NEGATIVE
Hgb urine dipstick: NEGATIVE
Ketones, ur: NEGATIVE
Leukocytes,Ua: NEGATIVE
Nitrite: NEGATIVE
RBC / HPF: NONE SEEN (ref 0–?)
Specific Gravity, Urine: 1.01 (ref 1.000–1.030)
Total Protein, Urine: NEGATIVE
Urine Glucose: NEGATIVE
Urobilinogen, UA: 0.2 (ref 0.0–1.0)
pH: 6 (ref 5.0–8.0)

## 2023-12-25 LAB — CBC WITH DIFFERENTIAL/PLATELET
Basophils Absolute: 0.1 10*3/uL (ref 0.0–0.1)
Basophils Relative: 1.3 % (ref 0.0–3.0)
Eosinophils Absolute: 0.3 10*3/uL (ref 0.0–0.7)
Eosinophils Relative: 5.1 % — ABNORMAL HIGH (ref 0.0–5.0)
HCT: 40.8 % (ref 39.0–52.0)
Hemoglobin: 13.5 g/dL (ref 13.0–17.0)
Lymphocytes Relative: 17.8 % (ref 12.0–46.0)
Lymphs Abs: 0.9 10*3/uL (ref 0.7–4.0)
MCHC: 33.1 g/dL (ref 30.0–36.0)
MCV: 94.2 fl (ref 78.0–100.0)
Monocytes Absolute: 0.5 10*3/uL (ref 0.1–1.0)
Monocytes Relative: 8.5 % (ref 3.0–12.0)
Neutro Abs: 3.6 10*3/uL (ref 1.4–7.7)
Neutrophils Relative %: 67.3 % (ref 43.0–77.0)
Platelets: 233 10*3/uL (ref 150.0–400.0)
RBC: 4.33 Mil/uL (ref 4.22–5.81)
RDW: 15 % (ref 11.5–15.5)
WBC: 5.3 10*3/uL (ref 4.0–10.5)

## 2023-12-25 LAB — BASIC METABOLIC PANEL WITH GFR
BUN: 17 mg/dL (ref 6–23)
CO2: 28 meq/L (ref 19–32)
Calcium: 9.4 mg/dL (ref 8.4–10.5)
Chloride: 104 meq/L (ref 96–112)
Creatinine, Ser: 0.9 mg/dL (ref 0.40–1.50)
GFR: 83.74 mL/min (ref >60.00)
Glucose, Bld: 88 mg/dL (ref 70–99)
Potassium: 4.1 meq/L (ref 3.5–5.1)
Sodium: 141 meq/L (ref 135–145)

## 2023-12-25 LAB — SEDIMENTATION RATE: Sed Rate: 2 mm/h (ref 0–20)

## 2023-12-25 LAB — PSA: PSA: 1.5 ng/mL (ref 0.10–4.00)

## 2023-12-25 NOTE — Progress Notes (Signed)
   Subjective:    Patient ID: Austin Furl., male    DOB: 20-May-1951, 73 y.o.   MRN: [id number]  DOS:  12/25/2023 Type of visit - description: Acute  Symptoms started 12/18/2023: C/o  gross hematuria, sometimes with clots, many of them look like strings of blood. No fever chills No nausea vomiting No flank or lower abdominal pain. No testicular discomfort No injury.  12/18/2023: Seen  w/ above sxs  UA showed around 20 RBCs, no WBCs, urine culture negative.  He is here because of ongoing symptoms.  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  Current Outpatient Medications  Medication Instructions   albuterol (VENTOLIN HFA) 108 (90 Base) MCG/ACT inhaler 2 puffs, Inhalation, Every 6 hours PRN   b complex vitamins capsule 1 capsule, Daily   clobetasol cream (TEMOVATE) 0.05 % Apply to effected area qd prn.   DHEA 10 MG CAPS Daily   Multiple Vitamin (MULTIVITAMIN) tablet 1 tablet, Daily   multivitamin-lutein (OCUVITE-LUTEIN) CAPS capsule 1 capsule, Daily   Omega-3 Fatty Acids (FISH OIL PO) (Dosepack) 3 times daily around food       Objective:   Physical Exam BP 116/76 (BP Location: Right Arm, Patient Position: Sitting, Cuff Size: Normal)   Pulse (!) 57   Temp 97.6 F (36.4 C) (Oral)   Resp 12   Ht 5\' 7"  (1.702 m)   Wt 161 lb (73 kg)   SpO2 97%   BMI 25.22 kg/m  General:   Well developed, NAD, BMI noted.  HEENT:  Normocephalic . Face  symmetric, atraumatic Lungs:  CTA B Normal respiratory effort, no intercostal retractions, no accessory muscle use. Heart: RRR,  no murmur.  Abdomen:  Not distended, soft, non-tender. No rebound or rigidity.  No CVA tenderness DRE: Deferred Skin: Not pale. Not jaundice Lower extremities: no pretibial edema bilaterally  Neurologic:  alert & oriented X3.  Speech normal, gait appropriate for age and unassisted Psych--  Cognition and judgment appear intact.  Cooperative with normal attention span and concentration.  Behavior appropriate. No anxious or depressed appearing.     Assessment     73 year old gentleman, PMH includes BCC, history of stroke, hyperlipidemia, presents with  Gross hematuria: Going on for 1 week, on and off, occasional clots.  No fever or chills.  Recent urine culture negative. Plan: BMP CBC PSA.  Patient rew\quest to repeat UA and urine culture.  Will do, also a sed rate. Keep appointment with urology 01/03/2024 Indications to seek immediate attention d/w pt: Fever chills, flank pain, inability to urinate.

## 2023-12-25 NOTE — Patient Instructions (Signed)
 Go to the lab. Provide blood and a urine sample.  Continue drinking plenty of fluids  See the urologist as planned  You need to seek immediate attention if: You have fever or chills. You have pain on your sides You are unable to urinate

## 2023-12-25 NOTE — Telephone Encounter (Signed)
 Copied from CRM [id number]. Topic: Clinical - Red Word Triage >> Dec 25, 2023  8:31 AM Fredrich Romans wrote: Red Word that prompted transfer to Nurse Triage: A lot of blood in urine  Chief Complaint: blood in urine; clots; bright red Symptoms: see above Frequency: constant but not always bleeding as much Pertinent Negatives: Patient denies fever, flank pain Disposition: [] ED /[] Urgent Care (no appt availability in office) / [x] Appointment(In office/virtual)/ []  [place] [facility]/ [] Home Care/ [] Refused Recommended Disposition /[] [place] [facility]/ []  Follow-up with PCP Additional Notes: UA was completed last week and he has apt with urology next week.  Per protocol apt made for today; care advice given, denies questions; instructed to go to ER if becomes worse.   Reason for Disposition  [1] Pain or burning with passing urine AND [2] side (flank) or back pain present  Answer Assessment - Initial Assessment Questions 1. COLOR of URINE: "Describe the color of the urine."  (e.g., tea-colored, pink, red, bloody) "Do you have blood clots in your urine?" (e.g., none, pea, grape, small coin)     Bright red with clots 2. ONSET: "When did the bleeding start?"      Last week 3. EPISODES: "How many times has there been blood in the urine?" or "How many times today?"     Not every time and first time was Last Monday 4. PAIN with URINATION: "Is there any pain with passing your urine?" If Yes, ask: "How bad is the pain?"  (Scale 1-10; or mild, moderate, severe)    - MILD: Complains slightly about urination hurting.    - MODERATE: Interferes with normal activities.      - SEVERE: Excruciating, unwilling or unable to urinate because of the pain.      Sometimes it stings a little and discomfort 5. FEVER: "Do you have a fever?" If Yes, ask: "What is your temperature, how was it measured, and when did it start?"     denies 6. ASSOCIATED SYMPTOMS: "Are you passing urine more frequently than usual?"      About the same 7. OTHER SYMPTOMS: "Do you have any other symptoms?" (e.g., back/flank pain, abdomen pain, vomiting)     Back pain 8. PREGNANCY: "Is there any chance you are pregnant?" "When was your last menstrual period?"     na  Protocols used: Urine - Blood In-A-AH

## 2023-12-26 LAB — URINE CULTURE
MICRO NUMBER:: 16267722
Result:: NO GROWTH
SPECIMEN QUALITY:: ADEQUATE

## 2023-12-27 ENCOUNTER — Encounter: Payer: Self-pay | Admitting: Internal Medicine

## 2024-01-03 ENCOUNTER — Encounter: Payer: Self-pay | Admitting: Urology

## 2024-01-03 ENCOUNTER — Ambulatory Visit: Admitting: Urology

## 2024-01-03 VITALS — BP 149/90 | HR 60 | Ht 67.0 in | Wt 160.0 lb

## 2024-01-03 DIAGNOSIS — R310 Gross hematuria: Secondary | ICD-10-CM

## 2024-01-03 LAB — URINALYSIS, ROUTINE W REFLEX MICROSCOPIC
Bilirubin, UA: NEGATIVE
Glucose, UA: NEGATIVE
Ketones, UA: NEGATIVE
Leukocytes,UA: NEGATIVE
Nitrite, UA: NEGATIVE
Protein,UA: NEGATIVE
RBC, UA: NEGATIVE
Specific Gravity, UA: 1.015 (ref 1.005–1.030)
Urobilinogen, Ur: 0.2 mg/dL (ref 0.2–1.0)
pH, UA: 6.5 (ref 5.0–7.5)

## 2024-01-03 NOTE — Progress Notes (Signed)
 Assessment: 1. Gross hematuria     Plan: I personally reviewed the patient's chart including provider notes, and lab results. Today I had a discussion with the patient regarding the findings of gross hematuria including the implications and differential diagnoses associated with it.  I also discussed recommendations for further evaluation including the rationale for upper tract imaging and cystoscopy.  I discussed the nature of these procedures including potential risk and complications.  The patient expressed an understanding of these issues. Schedule for CT hematuria protocol followed by cystoscopy.  Chief Complaint:  Chief Complaint  Patient presents with   Hematuria    History of Present Illness:  Austin Ramirez. is a 73 y.o. male who is seen in consultation from Alfredia Ferguson, PA-C for evaluation of gross hematuria. He reports passing some small clots approximately 2 weeks ago.  He was seen on 12/18/2023.  No flank pain or dysuria.  On 12/24/2023, he passed a large clot and had associated gross hematuria.  Since that time, he has had no further clots or hematuria.  No other urinary symptoms. He does report some baseline frequency, urgency, and nocturia x 2. IPSS = 8/2  Laboratory evaluation: U/A 12/18/23: 11-20 RBCs Urine culture: No growth U/A 12/25/23: Negative PSA 12/25/2023: 1.5  No recent imaging studies. No tobacco use.  No history of kidney stones or UTIs.  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  Allergies:  Allergies  Allergen Reactions   Almond Meal (Obsolete) Swelling    Family History:  Family History  Problem Relation Age of Onset   Arthritis Mother    Heart disease Mother    Arthritis Father    Heart disease Father    Hypertension Father    ADD / ADHD Neg Hx     Social History:  Social History   Tobacco Use   Smoking status: Never   Smokeless tobacco: Never  Vaping Use   Vaping status: Never Used  Substance Use Topics   Alcohol use: Yes    Alcohol/week: 0.0 standard drinks of alcohol   Drug use: No    Review of symptoms:  Constitutional:  Negative for unexplained weight loss, night sweats, fever, chills ENT:  Negative for nose bleeds, sinus pain, painful swallowing CV:  Negative for chest pain, shortness of breath, exercise intolerance, palpitations, loss of consciousness Resp:  Negative for cough, wheezing, shortness of breath GI:  Negative for nausea, vomiting, diarrhea, bloody stools GU:  Positives noted in HPI; otherwise negative for dysuria, urinary incontinence Neuro:  Negative for seizures, poor balance, limb weakness, slurred speech Psych:  Negative for lack of energy, depression, anxiety Endocrine:  Negative for polydipsia, polyuria, symptoms of hypoglycemia (dizziness, hunger, sweating) Hematologic:  Negative for anemia, purpura, petechia, prolonged or excessive bleeding, use of anticoagulants  Allergic:  Negative for difficulty breathing or choking as a result of exposure  to anything; no shellfish allergy; no allergic response (rash/itch) to materials, foods  Physical exam: BP (!) 149/90   Pulse 60   Ht 5\' 7"  (1.702 m)   Wt 160 lb (72.6 kg)   BMI 25.06 kg/m  GENERAL APPEARANCE:  Well appearing, well developed, well nourished, NAD HEENT: Atraumatic, Normocephalic, oropharynx clear. NECK: Supple without lymphadenopathy or thyromegaly. LUNGS: Clear to  auscultation bilaterally. HEART: Regular Rate and Rhythm without murmurs, gallops, or rubs. ABDOMEN: Soft, non-tender, No Masses. EXTREMITIES: Moves all extremities well.  Without clubbing, cyanosis, or edema. NEUROLOGIC:  Alert and oriented x 3, normal gait, CN II-XII grossly intact.  MENTAL STATUS:  Appropriate. BACK:  Non-tender to palpation.  No CVAT SKIN:  Warm, dry and intact.    Results: U/A:  negative

## 2024-01-08 ENCOUNTER — Ambulatory Visit: Admitting: Urology

## 2024-01-12 ENCOUNTER — Other Ambulatory Visit

## 2024-01-15 ENCOUNTER — Ambulatory Visit
Admission: RE | Admit: 2024-01-15 | Discharge: 2024-01-15 | Disposition: A | Discharge status: Home / Self Care | Source: Ambulatory Visit | Attending: Urology | Admitting: Urology

## 2024-01-15 DIAGNOSIS — R310 Gross hematuria: Secondary | ICD-10-CM

## 2024-01-15 MED ORDER — IOPAMIDOL (ISOVUE-300) INJECTION 61%
100.0000 mL | Freq: Once | INTRAVENOUS | Status: AC | PRN
  Administered 2024-01-15: 100 mL via INTRAVENOUS

## 2024-01-21 NOTE — Assessment & Plan Note (Signed)
 Encourage heart healthy diet such as MIND or DASH diet, increase exercise, avoid trans fats, simple carbohydrates and processed foods, consider a krill or fish or flaxseed oil cap daily.

## 2024-01-21 NOTE — Assessment & Plan Note (Signed)
Avoid offending foods, start probiotics. Do not eat large meals in late evening and consider raising head of bed.  

## 2024-01-21 NOTE — Assessment & Plan Note (Signed)
Working with urology

## 2024-01-22 ENCOUNTER — Ambulatory Visit (INDEPENDENT_AMBULATORY_CARE_PROVIDER_SITE_OTHER): Payer: Medicare Other | Admitting: Family Medicine

## 2024-01-22 ENCOUNTER — Encounter: Payer: Self-pay | Admitting: Family Medicine

## 2024-01-22 VITALS — BP 118/68 | HR 52 | Resp 14 | Ht 67.0 in | Wt 164.2 lb

## 2024-01-22 DIAGNOSIS — K219 Gastro-esophageal reflux disease without esophagitis: Secondary | ICD-10-CM

## 2024-01-22 DIAGNOSIS — R310 Gross hematuria: Secondary | ICD-10-CM | POA: Diagnosis not present

## 2024-01-22 DIAGNOSIS — E782 Mixed hyperlipidemia: Secondary | ICD-10-CM

## 2024-01-22 NOTE — Patient Instructions (Signed)
 Blood in the Pee (Hematuria) in Adults: What to Know  Hematuria is blood in the pee. You may be able to see blood in the pee. In some cases, a health care provider may find blood with a test.  Blood in the pee can be caused by infections of the kidney, bladder, or the urethra. The urethra is the tube that drains pee from the bladder.  Other causes may include: Kidney stones. Infection of the prostate. Cancer. Too much calcium in the pee. Conditions that are passed from parent to child. Too much exercise. Infections can be treated with medicine. A kidney stone will usually leave your body when you pee. If infections or kidney stones didn't cause the blood in the urine, then more tests may be needed. It is very important to tell your provider about any blood in your pee, even if you have no pain or the blood stops with no treatment. Blood in the pee can be a sign of a very serious problem, such as cancer. Follow these instructions at home: Medicines Take your medicines only as told. If you were given antibiotics, take them as told. Do not stop taking them even if you start to feel better. Eating and drinking Drink more fluids as told. Aim to drink 3-4 quarts (2.8-3.8 L) a day. Avoid caffeine, tea, and carbonated drinks. These can bother the bladder. Avoid alcohol if a male because it may irritate the prostate. General instructions If you have been diagnosed with a kidney stone, strain your pee to catch the stone if told by your provider. Empty your bladder often. Avoid holding pee for a long time. If you're male, make sure that: You wipe from front to back after using the bathroom. You use each piece of toilet paper only once. You pee before and after sex. It's up to you to get the results of any tests. Ask when your results will be ready and how to get them. You may need to call or meet with your provider to get your results. Keep all follow-up visits. Your provider will need to know  about any changes or any new symptoms. Contact a health care provider if: Your symptoms don't get better after 3 days. Your symptoms get worse. You have back pain or belly pain. You have a fever or chills. You throw up or feel like you may throw up. You throw up every time you take medicine. Get help right away if: You pass blood clots in your pee. You pass out. These symptoms may be an emergency. Call 911 right away. Do not wait to see if the symptoms will go away. Do not drive yourself to the hospital. This information is not intended to replace advice given to you by your health care provider. Make sure you discuss any questions you have with your health care provider. Document Revised: 06/29/2023 Document Reviewed: 06/08/2023 Elsevier Patient Education  2024 [employer].

## 2024-01-22 NOTE — Progress Notes (Signed)
 Subjective:    Patient ID: Austin Georgia., male    DOB: 06-30-1951, 73 y.o.   MRN: [id number]  Chief Complaint  Patient presents with   Medical Management of Chronic Issues    Patient presents today for a 6 month follow-up   Quality Metric Gaps    AWV, Hep C screening    HPI Discussed the use of AI scribe software for clinical note transcription with the patient, who gave verbal consent to proceed.  History of Present Illness Austin Luong. is a 73 year old male who presents with a history of blood in urine following increased physical activity.  Approximately a week ago, he underwent a CT scan to investigate the presence of blood in his urine, but he has not yet received the results. He noticed blood in his urine about two days after spending double his normal time on his bike. The blood appeared as small clots. He has not experienced any further episodes of blood in his urine for the past month.  No pain, fever, chills, diarrhea, or new back pain. Appetite remains unchanged. He experiences normal aches and pains associated with aging and a previous stroke, but nothing alarming or indicative of a new issue.  He has a history of elevated cholesterol levels, with the last recorded level being 242 mg/dL. His HDL was 72 mg/dL, which is slightly lower than a previous measurement of 81 mg/dL. He attributes this change to a decrease in jogging and increased biking.  He is currently retired and engages in regular biking activities. No significant changes in lifestyle or health behaviors recently.    Past Medical History:  Diagnosis Date   Allergic state 12/08/2015   Allergy    BCC (basal cell carcinoma of skin) 12/08/2015   Cancer (HCC)    skin cancer   Dermatitis 10/11/2016   Dysphagia 12/08/2015   Esophageal reflux 12/08/2015   Frequent headaches    GERD (gastroesophageal reflux disease)    Heart murmur    Hyperlipidemia    Hyperlipidemia, mixed 12/08/2015   Low back pain  12/08/2015   Nocturia 04/11/2017   Preventative health care 12/08/2015   Rib pain 12/08/2015   Sacroiliac dysfunction 04/11/2017    History reviewed. No pertinent surgical history.  Family History  Problem Relation Age of Onset   Arthritis Mother    Heart disease Mother    Arthritis Father    Heart disease Father    Hypertension Father    ADD / ADHD Neg Hx     Social History   Socioeconomic History   Marital status: Married    Spouse name: Not on file   Number of children: Not on file   Years of education: Not on file   Highest education level: Some college, no degree  Occupational History   Occupation: Retired  Tobacco Use   Smoking status: Never   Smokeless tobacco: Never  Vaping Use   Vaping status: Never Used  Substance and Sexual Activity   Alcohol use: Yes    Alcohol/week: 0.0 standard drinks of alcohol   Drug use: No   Sexual activity: Yes    Comment: lives with wife, ultra marathon runner, 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,   Other Topics Concern   Not on file  Social History Narrative   Not on file   Social Drivers of Health   Financial Resource Strain: Low Risk  (01/20/2023)   Overall Financial Resource Strain (CARDIA)    Difficulty of Paying Living Expenses:  Not hard at all  Food Insecurity: No Food Insecurity (01/20/2023)   Hunger Vital Sign    Worried About Running Out of Food in the Last Year: Never true    Ran Out of Food in the Last Year: Never true  Transportation Needs: No Transportation Needs (01/20/2023)   PRAPARE - Administrator, Civil Service (Medical): No    Lack of Transportation (Non-Medical): No  Physical Activity: Sufficiently Active (01/20/2023)   Exercise Vital Sign    Days of Exercise per Week: 4 days    Minutes of Exercise per Session: 120 min  Stress: No Stress Concern Present (01/20/2023)   [employer] of Occupational Health - Occupational Stress Questionnaire    Feeling of Stress : Not at all  Social Connections:  Socially Integrated (01/20/2023)   Social Connection and Isolation Panel [NHANES]    Frequency of Communication with Friends and Family: More than three times a week    Frequency of Social Gatherings with Friends and Family: More than three times a week    Attends Religious Services: More than 4 times per year    Active Member of [employer] or Organizations: Yes    Attends Banker Meetings: More than 4 times per year    Marital Status: Married  Catering manager Violence: Not At Risk (09/09/2022)   [facility]    Threatened: Not on file    Insulted: Not on file    Physically Hurt : Not on file    Scream: Not on file    Outpatient Medications Prior to Visit  Medication Sig Dispense Refill   albuterol  (VENTOLIN  HFA) 108 (90 Base) MCG/ACT inhaler Inhale 2 puffs into the lungs every 6 (six) hours as needed for wheezing or shortness of breath. 18 g 0   b complex vitamins capsule Take 1 capsule by mouth daily.     clobetasol  cream (TEMOVATE ) 0.05 % Apply to effected area qd prn. 60 g 1   DHEA 10 MG CAPS Take by mouth daily.     Multiple Vitamin (MULTIVITAMIN) tablet Take 1 tablet by mouth daily.     multivitamin-lutein (OCUVITE-LUTEIN) CAPS capsule Take 1 capsule by mouth daily.     Omega-3 Fatty Acids (FISH OIL PO) Take by mouth 3 x daily with food.     No facility-administered medications prior to visit.    Allergies  Allergen Reactions   Almond Meal (Obsolete) Swelling    Review of Systems  Constitutional:  Negative for fever and malaise/fatigue.  HENT:  Negative for congestion.   Eyes:  Negative for blurred vision.  Respiratory:  Negative for shortness of breath.   Cardiovascular:  Negative for chest pain, palpitations and leg swelling.  Gastrointestinal:  Negative for abdominal pain, blood in stool and nausea.  Genitourinary:  Negative for dysuria and frequency.  Musculoskeletal:  Negative for falls.  Skin:  Negative for rash.   Neurological:  Positive for focal weakness. Negative for dizziness, loss of consciousness and headaches.  Endo/Heme/Allergies:  Negative for environmental allergies.  Psychiatric/Behavioral:  Negative for depression. The patient is not nervous/anxious.        Objective:    Physical Exam Vitals reviewed.  Constitutional:      Appearance: Normal appearance. He is not ill-appearing.  HENT:     Head: Normocephalic and atraumatic.     Nose: Nose normal.  Eyes:     Conjunctiva/sclera: Conjunctivae normal.  Cardiovascular:     Rate and Rhythm:  Normal rate.     Pulses: Normal pulses.     Heart sounds: Normal heart sounds. No murmur heard. Pulmonary:     Effort: Pulmonary effort is normal.     Breath sounds: Normal breath sounds. No wheezing.  Abdominal:     Palpations: Abdomen is soft. There is no mass.     Tenderness: There is no abdominal tenderness.  Musculoskeletal:     Cervical back: Normal range of motion.     Right lower leg: No edema.     Left lower leg: No edema.  Skin:    General: Skin is warm and dry.  Neurological:     General: No focal deficit present.     Mental Status: He is alert and oriented to person, place, and time.  Psychiatric:        Mood and Affect: Mood normal.     BP 118/68   Pulse (!) 52   Resp 14   Ht 5\' 7"  (1.702 m)   Wt 164 lb 3.2 oz (74.5 kg)   SpO2 97%   BMI 25.72 kg/m  Wt Readings from Last 3 Encounters:  01/22/24 164 lb 3.2 oz (74.5 kg)  01/03/24 160 lb (72.6 kg)  12/25/23 161 lb (73 kg)    Diabetic Foot Exam - Simple   No data filed    Lab Results  Component Value Date   WBC 5.3 12/25/2023   HGB 13.5 12/25/2023   HCT 40.8 12/25/2023   PLT 233.0 12/25/2023   GLUCOSE 88 12/25/2023   CHOL 242 (H) 07/24/2023   TRIG 227.0 (H) 07/24/2023   HDL 72.60 07/24/2023   LDLDIRECT 144.0 12/08/2015   LDLCALC 124 (H) 07/24/2023   ALT 14 07/24/2023   AST 24 07/24/2023   NA 141 12/25/2023   K 4.1 12/25/2023   CL 104 12/25/2023    CREATININE 0.90 12/25/2023   BUN 17 12/25/2023   CO2 28 12/25/2023   TSH 3.27 07/24/2023   PSA 1.50 12/25/2023    Lab Results  Component Value Date   TSH 3.27 07/24/2023   Lab Results  Component Value Date   WBC 5.3 12/25/2023   HGB 13.5 12/25/2023   HCT 40.8 12/25/2023   MCV 94.2 12/25/2023   PLT 233.0 12/25/2023   Lab Results  Component Value Date   NA 141 12/25/2023   K 4.1 12/25/2023   CO2 28 12/25/2023   GLUCOSE 88 12/25/2023   BUN 17 12/25/2023   CREATININE 0.90 12/25/2023   BILITOT 0.5 07/24/2023   ALKPHOS 64 07/24/2023   AST 24 07/24/2023   ALT 14 07/24/2023   PROT 6.7 07/24/2023   ALBUMIN 4.4 07/24/2023   CALCIUM 9.4 12/25/2023   GFR 83.74 12/25/2023   Lab Results  Component Value Date   CHOL 242 (H) 07/24/2023   Lab Results  Component Value Date   HDL 72.60 07/24/2023   Lab Results  Component Value Date   LDLCALC 124 (H) 07/24/2023   Lab Results  Component Value Date   TRIG 227.0 (H) 07/24/2023   Lab Results  Component Value Date   CHOLHDL 3 07/24/2023   No results found for: "HGBA1C"     Assessment & Plan:  Gross hematuria Assessment & Plan: Working with urology   Hyperlipidemia, mixed Assessment & Plan: Encourage heart healthy diet such as MIND or DASH diet, increase exercise, avoid trans fats, simple carbohydrates and processed foods, consider a krill or fish or flaxseed oil cap daily.     Gastroesophageal reflux  disease, unspecified whether esophagitis present Assessment & Plan: Avoid offending foods, start probiotics. Do not eat large meals in late evening and consider raising head of bed.       Assessment and Plan Assessment & Plan Hyperlipidemia, mixed Cholesterol levels elevated with decreased HDL. Asymptomatic. Testing deferred to prioritize other concerns. - Defer cholesterol testing until next visit.     Randie Bustle, MD

## 2024-01-23 ENCOUNTER — Ambulatory Visit: Payer: Medicare Other | Admitting: Family Medicine

## 2024-01-25 ENCOUNTER — Telehealth: Payer: Self-pay | Admitting: Urology

## 2024-01-25 ENCOUNTER — Encounter: Payer: Self-pay | Admitting: Urology

## 2024-01-25 NOTE — Telephone Encounter (Signed)
 Pt wife called about Ct results that just got resulted today. Please advise. Thank You.

## 2024-01-26 IMAGING — US US SOFT TISSUE HEAD/NECK
1 series · 7 of 7 positions shown · non-contrast
Comparison: None Available.

CLINICAL DATA: Left neck swelling/lump for 1 year

EXAM:
ULTRASOUND OF HEAD/NECK SOFT TISSUES
TECHNIQUE: Ultrasound examination of the head and neck soft tissues was
performed in the area of clinical concern.

[Series 1: us soft tissue head/neck · 7 of 7 slices shown]
[im 1/7]
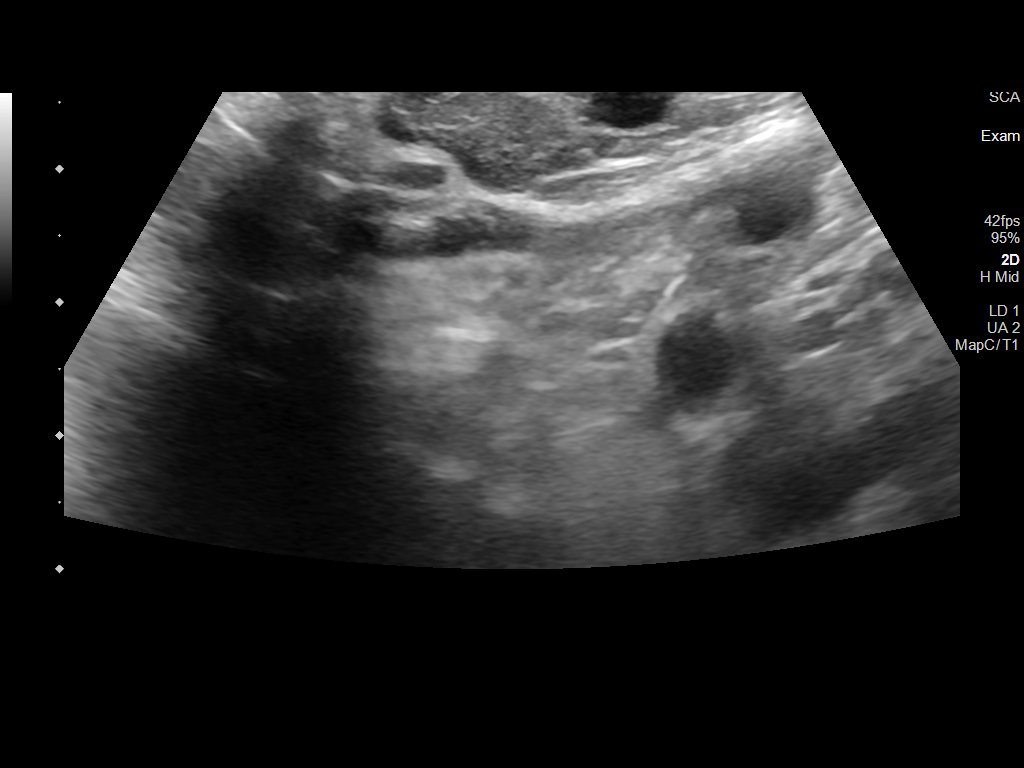
[im 2/7]
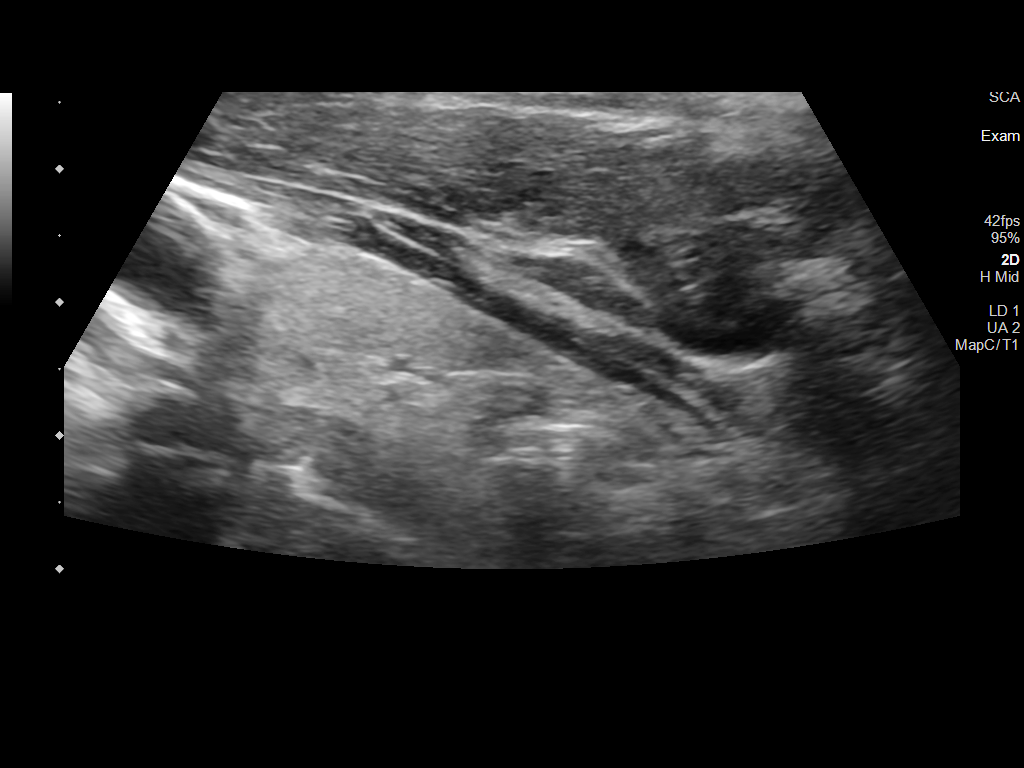
[im 3/7]
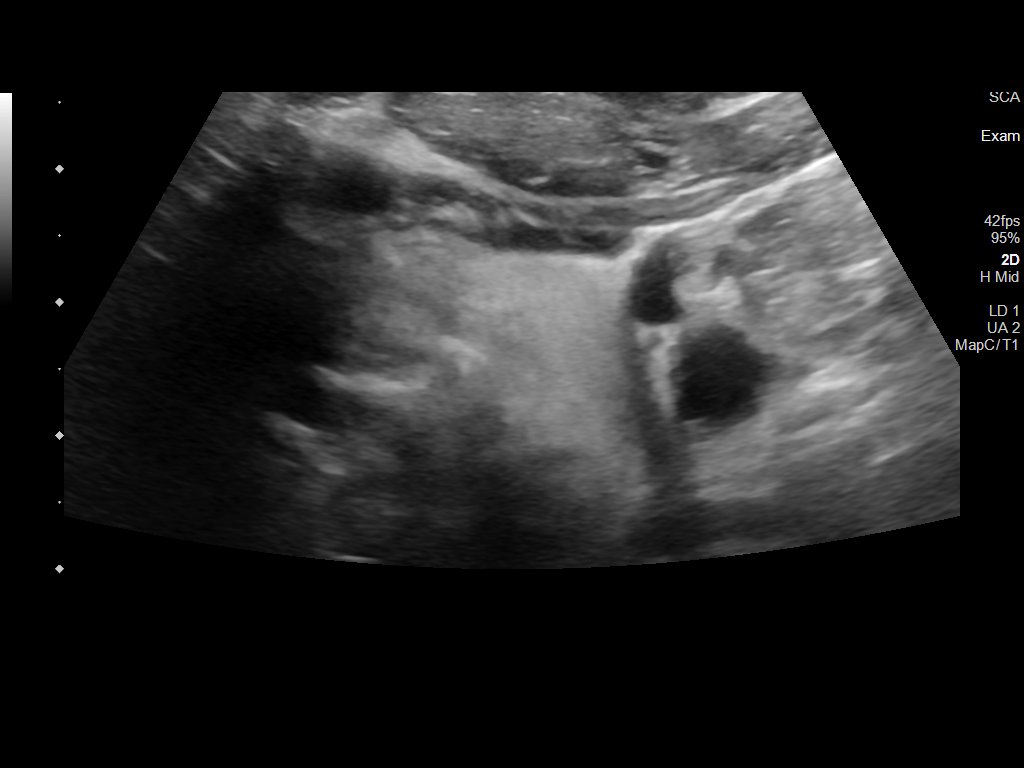
[im 4/7]
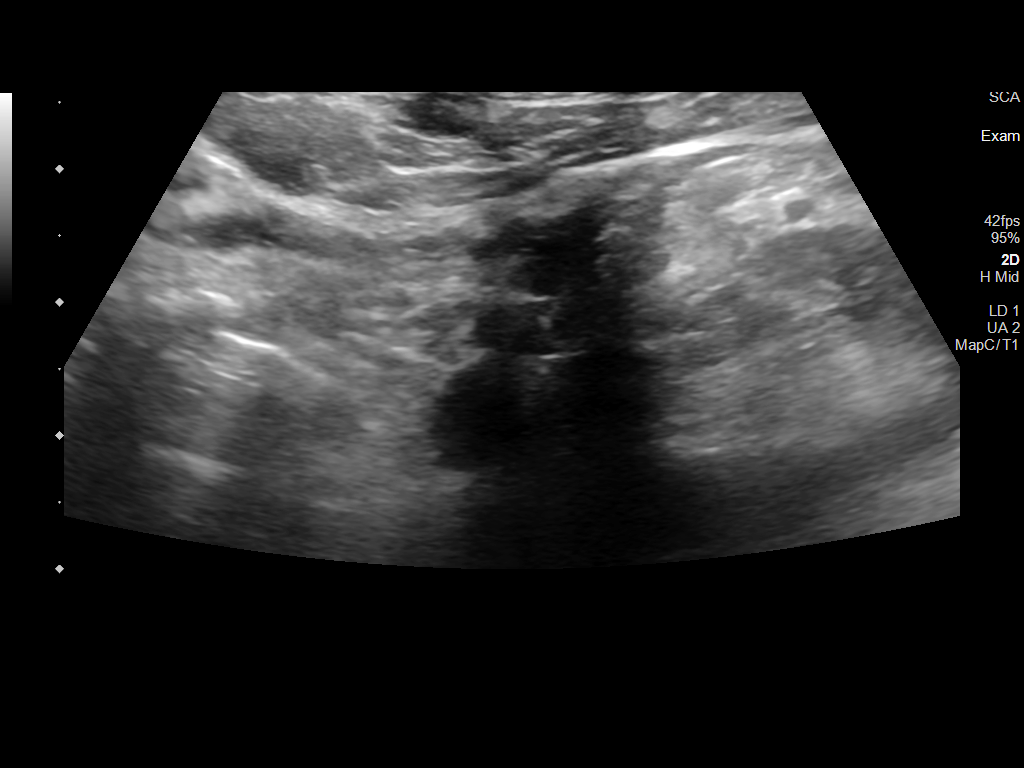
[im 5/7]
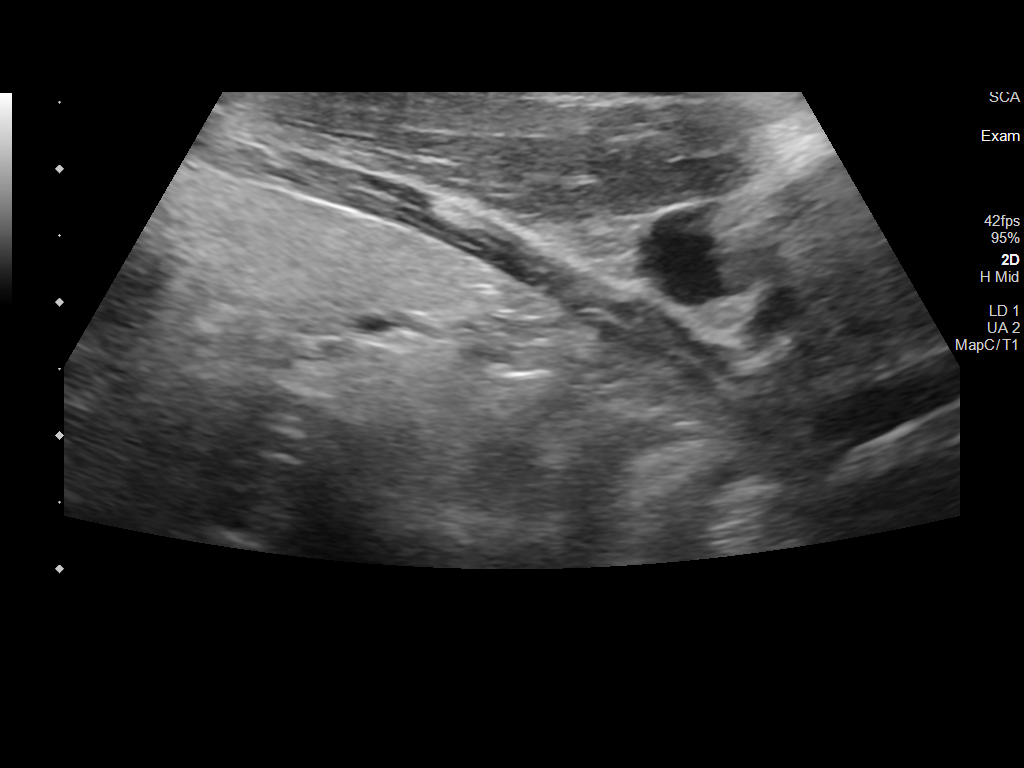
[im 6/7]
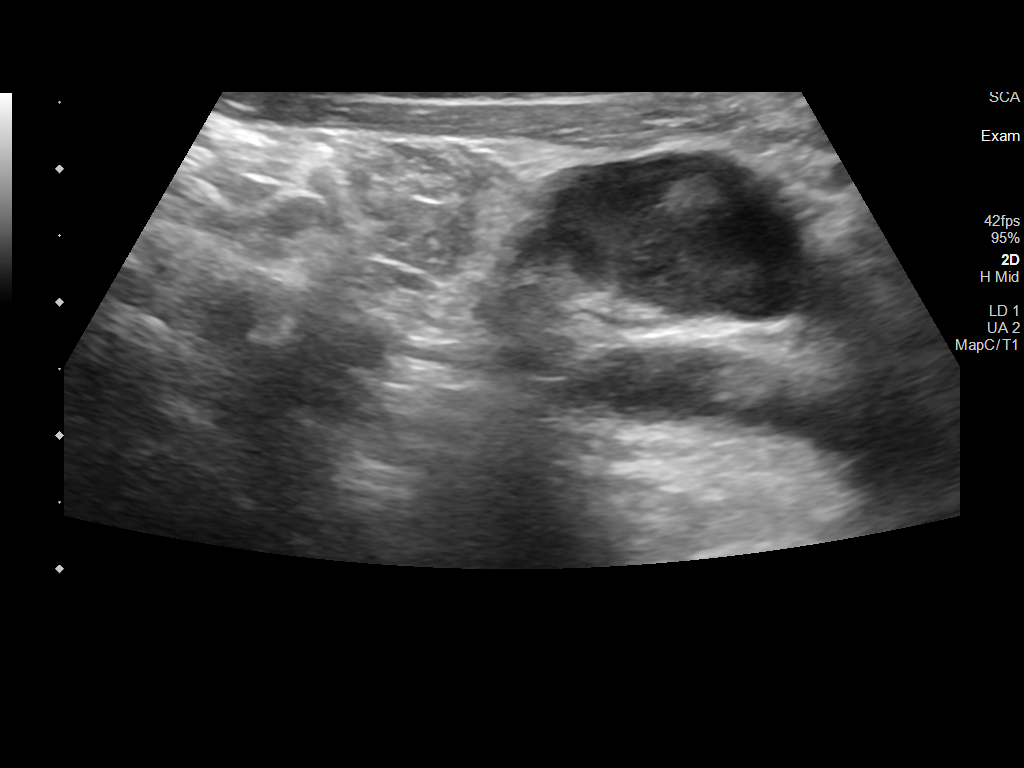
[im 7/7]
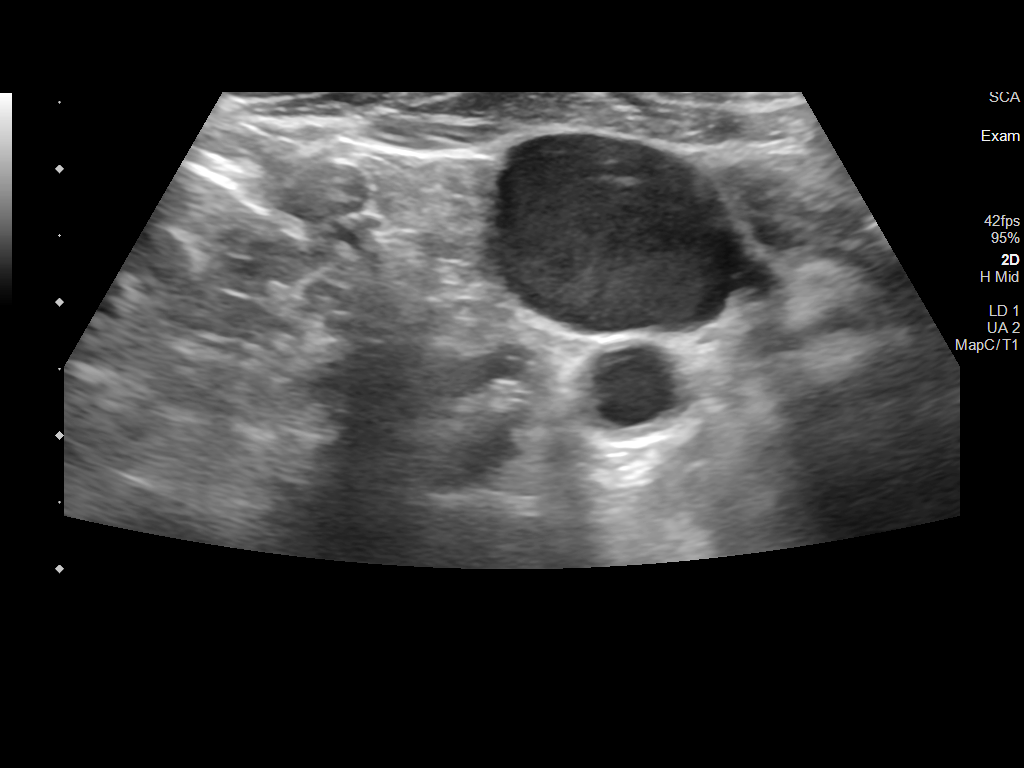

[7 of 7 positions shown; findings below may reference images not displayed]

FINDINGS: Representative of the bilateral neck do not show any mass or
adenopathy, only vascular structures, fat, normal gland, and muscle.
Reportedly, the patient could not identify any palpable nodule for
specific interrogation.
IMPRESSION: Negative sonogram.  No identified mass or adenopathy.

## 2024-01-29 ENCOUNTER — Telehealth: Payer: Self-pay | Admitting: Urology

## 2024-01-29 NOTE — Telephone Encounter (Signed)
 Lvm on patients phone to call back and schedule. Will follow up in a few days if patient does not call back.

## 2024-01-29 NOTE — Telephone Encounter (Signed)
-----   Message from Oda Bence sent at 01/25/2024  5:17 PM EDT ----- Schedule appointment for cystoscopy next available.

## 2024-01-30 ENCOUNTER — Telehealth: Payer: Self-pay | Admitting: Family Medicine

## 2024-01-30 NOTE — Telephone Encounter (Signed)
 Copied from CRM [id number]. Topic: Medicare AWV >> Jan 30, 2024 10:32 AM Juliana Ocean wrote: Reason for CRM: LVM 01/30/2024 to schedule AWV. Please schedule Virtual or Telehealth visits ONLY  Rosalee Collins; Care Guide Ambulatory Clinical Support [place] [facility] Direct Dial: [phone]

## 2024-01-31 ENCOUNTER — Telehealth: Payer: Self-pay | Admitting: Urology

## 2024-01-31 NOTE — Telephone Encounter (Signed)
 Pt wife stated everything came back fine and does not wish to schedule anything at this time. I sent a message to [place] letting him know this information.

## 2024-02-05 DIAGNOSIS — H40053 Ocular hypertension, bilateral: Secondary | ICD-10-CM | POA: Diagnosis not present

## 2024-02-12 DIAGNOSIS — H401131 Primary open-angle glaucoma, bilateral, mild stage: Secondary | ICD-10-CM | POA: Diagnosis not present

## 2024-02-12 DIAGNOSIS — H2513 Age-related nuclear cataract, bilateral: Secondary | ICD-10-CM | POA: Diagnosis not present

## 2024-02-12 DIAGNOSIS — H43812 Vitreous degeneration, left eye: Secondary | ICD-10-CM | POA: Diagnosis not present

## 2024-03-11 DIAGNOSIS — H401131 Primary open-angle glaucoma, bilateral, mild stage: Secondary | ICD-10-CM | POA: Diagnosis not present

## 2024-03-11 DIAGNOSIS — H43812 Vitreous degeneration, left eye: Secondary | ICD-10-CM | POA: Diagnosis not present

## 2024-03-12 DIAGNOSIS — M9901 Segmental and somatic dysfunction of cervical region: Secondary | ICD-10-CM | POA: Diagnosis not present

## 2024-03-12 DIAGNOSIS — M546 Pain in thoracic spine: Secondary | ICD-10-CM | POA: Diagnosis not present

## 2024-03-12 DIAGNOSIS — M9902 Segmental and somatic dysfunction of thoracic region: Secondary | ICD-10-CM | POA: Diagnosis not present

## 2024-03-12 DIAGNOSIS — M5412 Radiculopathy, cervical region: Secondary | ICD-10-CM | POA: Diagnosis not present

## 2024-03-27 ENCOUNTER — Telehealth: Payer: Self-pay | Admitting: Family Medicine

## 2024-03-27 NOTE — Telephone Encounter (Signed)
 Copied from CRM [id number]. Topic: Medicare AWV >> Mar 27, 2024  1:35 PM Nathanel DEL wrote: Reason for CRM: LVM 03/27/2024 to schedule AWV. Please schedule Virtual or Telehealth visits ONLY.   Nathanel Paschal; Care Guide Ambulatory Clinical Support [place] [facility] Direct Dial: [phone]

## 2024-03-27 NOTE — Telephone Encounter (Signed)
 Copied from CRM [id number]. Topic: Medicare AWV >> Mar 27, 2024  1:32 PM Nathanel DEL wrote: Reason for CRM: LVM 03/27/2024 to schedule AWV. Please schedule Virtual or Telehealth visits ONLY.   Nathanel Paschal; Care Guide Ambulatory Clinical Support [place] [facility] Direct Dial: [phone]

## 2024-04-02 ENCOUNTER — Other Ambulatory Visit: Payer: Self-pay | Admitting: Family Medicine

## 2024-04-02 DIAGNOSIS — M5412 Radiculopathy, cervical region: Secondary | ICD-10-CM | POA: Diagnosis not present

## 2024-04-02 DIAGNOSIS — M546 Pain in thoracic spine: Secondary | ICD-10-CM | POA: Diagnosis not present

## 2024-04-02 DIAGNOSIS — M9901 Segmental and somatic dysfunction of cervical region: Secondary | ICD-10-CM | POA: Diagnosis not present

## 2024-04-02 DIAGNOSIS — M9902 Segmental and somatic dysfunction of thoracic region: Secondary | ICD-10-CM | POA: Diagnosis not present

## 2024-04-08 DIAGNOSIS — C44519 Basal cell carcinoma of skin of other part of trunk: Secondary | ICD-10-CM | POA: Diagnosis not present

## 2024-04-22 DIAGNOSIS — H2513 Age-related nuclear cataract, bilateral: Secondary | ICD-10-CM | POA: Diagnosis not present

## 2024-04-22 DIAGNOSIS — H401131 Primary open-angle glaucoma, bilateral, mild stage: Secondary | ICD-10-CM | POA: Diagnosis not present

## 2024-04-26 ENCOUNTER — Ambulatory Visit: Admitting: Student

## 2024-05-30 DIAGNOSIS — H401131 Primary open-angle glaucoma, bilateral, mild stage: Secondary | ICD-10-CM | POA: Diagnosis not present

## 2024-07-01 DIAGNOSIS — H401131 Primary open-angle glaucoma, bilateral, mild stage: Secondary | ICD-10-CM | POA: Diagnosis not present

## 2024-07-01 DIAGNOSIS — H2513 Age-related nuclear cataract, bilateral: Secondary | ICD-10-CM | POA: Diagnosis not present

## 2024-07-15 ENCOUNTER — Ambulatory Visit: Payer: Self-pay

## 2024-07-15 NOTE — Telephone Encounter (Signed)
 FYI Only or Action Required?: FYI only for provider.  Patient was last seen in primary care on 01/22/2024 by Domenica Harlene LABOR, MD.  Called Nurse Triage reporting Urinary Frequency and Dysuria.  Symptoms began couple weeks ago.  Interventions attempted: Nothing.  Symptoms are: urinary frequency, painful urination, foul odor to urine gradually worsening.  Triage Disposition: See Physician Within 24 Hours  Patient/caregiver understands and will follow disposition?: Yes           Copied from CRM #[id number]. Topic: Clinical - Red Word Triage >> Jul 15, 2024  8:44 AM Deaijah H wrote: Red Word that prompted transfer to Nurse Triage: Possible UTI .SABRA Burning, pain, frequent urination Reason for Disposition  All other males with painful urination  Answer Assessment - Initial Assessment Questions Called CAL for [facility], as wife and patient are requesting an appointment at their office and it is not within their region. Confirmed with Suzen okay to schedule.  1. SEVERITY: How bad is the pain?  (e.g., Scale 1-10; mild, moderate, or severe)     Moderate, patient describes it as not severe and unable to put numeric value to pain level.  2. FREQUENCY: How many times have you had painful urination today?      4-5 times.  3. PATTERN: Is pain present every time you urinate or just sometimes?      Every time.  4. ONSET: When did the painful urination start?      Couple weeks. Intermittent in strength/severity.  5. FEVER: Do you have a fever? If Yes, ask: What is your temperature, how was it measured, and when did it start?     No.  6. PAST UTI: Have you had a urine infection before? If Yes, ask: When was the last time? and What happened that time?      No.  7. CAUSE: What do you think is causing the painful urination?      UTI.  8. OTHER SYMPTOMS: Do you have any other symptoms? (e.g., flank pain, penis discharge, scrotal pain, blood in urine)      Urinary frequency, foul odor to urine. Denies flank or back pain, nausea, vomiting, blood in urine.  Protocols used: Urination Pain - Male-A-AH

## 2024-07-16 ENCOUNTER — Ambulatory Visit: Admitting: Family

## 2024-07-16 ENCOUNTER — Encounter: Payer: Self-pay | Admitting: Family

## 2024-07-16 VITALS — BP 130/80 | Temp 97.8°F | Ht 67.0 in | Wt 158.8 lb

## 2024-07-16 DIAGNOSIS — R350 Frequency of micturition: Secondary | ICD-10-CM | POA: Diagnosis not present

## 2024-07-16 DIAGNOSIS — R300 Dysuria: Secondary | ICD-10-CM

## 2024-07-16 LAB — POCT URINALYSIS DIPSTICK
Bilirubin, UA: NEGATIVE
Glucose, UA: NEGATIVE
Ketones, UA: NEGATIVE
Leukocytes, UA: NEGATIVE
Nitrite, UA: NEGATIVE
Protein, UA: POSITIVE — AB
Spec Grav, UA: 1.015 (ref 1.010–1.025)
Urobilinogen, UA: 0.2 U/dL
pH, UA: 6 (ref 5.0–8.0)

## 2024-07-16 MED ORDER — SULFAMETHOXAZOLE-TRIMETHOPRIM 800-160 MG PO TABS: 1.0000 | ORAL_TABLET | Freq: Two times a day (BID) | ORAL | 0 refills | Status: DC

## 2024-07-16 NOTE — Progress Notes (Signed)
 Acute Office Visit  Subjective:     Patient ID: Austin Ramirez., male    DOB: [birth year], 73 y.o.   MRN: [id number]  Chief Complaint  Patient presents with   Dysuria    HPI Patient is in today with c/o burning with urination, frequency and urgency over the last month. He has blood in his urine this morning. He is circumcised. No anal sex. Also reports that he rides his bike often and longer. Is unsure if symptoms are related.  Has seen urology in the past (May 2025) when he had similar symptoms with unknown etiology although he suspects it was UTI. Has had MRI and cystoscopy in the past. He did not have burning at that times.   Review of Systems  Respiratory: Negative.    Cardiovascular: Negative.   Gastrointestinal: Negative.   Genitourinary:  Positive for dysuria, frequency, hematuria and urgency. Negative for flank pain.  Musculoskeletal: Negative.   Neurological: Negative.   Psychiatric/Behavioral: Negative.    All other systems reviewed and are negative.  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    Social History   Socioeconomic History   Marital status: Married    Spouse name: Not on file   Number of children: Not on file   Years of education: Not on file   Highest education level: Some college, no degree  Occupational History   Occupation: Retired  Tobacco Use   Smoking status: Never   Smokeless tobacco: Never  Vaping Use   Vaping status: Never Used  Substance and Sexual Activity   Alcohol  use: Yes    Alcohol/week: 0.0 standard drinks of alcohol   Drug use: No   Sexual activity: Yes    Comment: lives with wife, ultra marathon runner, 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,   Other Topics Concern   Not on file  Social History Narrative   Not on file   Social Drivers of Health   Financial Resource Strain: Low Risk  (01/20/2023)   Overall Financial Resource Strain (CARDIA)    Difficulty of Paying Living Expenses: Not hard at all  Food Insecurity: No Food Insecurity (01/20/2023)   Hunger Vital Sign    Worried About Running Out of Food in the Last Year: Never true    Ran Out of Food in the Last Year: Never true  Transportation Needs: No Transportation Needs (01/20/2023)   PRAPARE - Administrator, Civil Service (Medical): No    Lack of Transportation (Non-Medical): No  Physical Activity: Sufficiently Active (01/20/2023)   Exercise Vital Sign    Days of Exercise per Week: 4 days    Minutes of Exercise per Session: 120 min  Stress: No Stress Concern Present (01/20/2023)   [employer] of Occupational Health - Occupational Stress Questionnaire    Feeling of Stress : Not at all  Social Connections: Socially Integrated (01/20/2023)   Social Connection and Isolation Panel    Frequency of Communication with Friends and  Family: More than three times a week    Frequency of Social Gatherings with Friends and Family: More than three times a week    Attends Religious Services: More than 4 times per year    Active Member of [employer] or Organizations: Yes    Attends Banker Meetings: More than 4 times per year    Marital Status: Married  Catering manager Violence: Not At Risk (09/09/2022)   Received from [facility]    Threatened: Not on file    Insulted: Not on file    Physically Hurt : Not on file    Scream: Not on file    History reviewed. No pertinent surgical history.  Family History  Problem Relation Age of Onset   Arthritis Mother     Heart disease Mother    Arthritis Father    Heart disease Father    Hypertension Father    ADD / ADHD Neg Hx     Allergies  Allergen Reactions   Almond Meal (Obsolete) Swelling    Current Outpatient Medications on File Prior to Visit  Medication Sig Dispense Refill   clobetasol  cream (TEMOVATE ) 0.05 % APPLY TO AFFECTED AREA DAILY AS NEEDED. 60 g 0   DHEA 10 MG CAPS Take by mouth daily.     Multiple Vitamin (MULTIVITAMIN) tablet Take 1 tablet by mouth daily.     multivitamin-lutein (OCUVITE-LUTEIN) CAPS capsule Take 1 capsule by mouth daily.     Omega-3 Fatty Acids (FISH OIL PO) Take by mouth 3 x daily with food.     No current facility-administered medications on file prior to visit.    BP 130/80   Temp 97.8 F (36.6 C)   Ht 5' 7 (1.702 m)   Wt 158 lb 12.8 oz (72 kg)   BMI 24.87 kg/m chart      Objective:    BP 130/80   Temp 97.8 F (36.6 C)   Ht 5' 7 (1.702 m)   Wt 158 lb 12.8 oz (72 kg)   BMI 24.87 kg/m    Physical Exam Vitals and nursing note reviewed.  Constitutional:      Appearance: Normal appearance. He is normal weight.  Cardiovascular:     Rate and Rhythm: Normal rate and regular rhythm.  Pulmonary:     Effort: Pulmonary effort is normal.     Breath sounds: Normal breath sounds.  Abdominal:     General: Abdomen is flat. Bowel sounds are normal.     Palpations: Abdomen is soft.  Musculoskeletal:        General: Normal range of motion.  Skin:    General: Skin is warm and dry.  Neurological:     General: No focal deficit present.     Mental Status: He is alert and oriented to person, place, and time. Mental status is at baseline.  Psychiatric:        Mood and Affect: Mood normal.        Behavior: Behavior normal.     Results for orders placed or performed in visit on 07/16/24  POC Urinalysis Dipstick  Result Value Ref Range   Color, UA Dark Yellow    Clarity, UA Cloudy    Glucose, UA Negative Negative   Bilirubin, UA Negative     Ketones, UA Negative    Spec Grav, UA 1.015 1.010 - 1.025   Blood, UA Large (A)    pH, UA 6.0 5.0 - 8.0   Protein, UA Positive (  A) Negative   Urobilinogen, UA 0.2 0.2 or 1.0 E.U./dL   Nitrite, UA Negative    Leukocytes, UA Negative Negative   Appearance     Odor          Assessment & Plan:   Problem List Items Addressed This Visit   None Visit Diagnoses       Urinary frequency    -  Primary   Relevant Orders   Urine Culture   POC Urinalysis Dipstick (Completed)     Dysuria           Meds ordered this encounter  Medications   sulfamethoxazole-trimethoprim (BACTRIM DS) 800-160 MG tablet    Sig: Take 1 tablet by mouth 2 (two) times daily.    Dispense:  14 tablet    Refill:  0   Call the office if symptoms worsen or persist. Recheck pending the results of the culture. Will send back to urology if culture not consistent with UTI.  No follow-ups on file.  Lineth Thielke B Ely Ballen, FNP

## 2024-07-17 LAB — URINE CULTURE
MICRO NUMBER:: 17127474
Result:: NO GROWTH
SPECIMEN QUALITY:: ADEQUATE

## 2024-07-18 ENCOUNTER — Ambulatory Visit: Payer: Self-pay | Admitting: Family

## 2024-07-19 ENCOUNTER — Other Ambulatory Visit: Payer: Self-pay | Admitting: Family

## 2024-07-19 DIAGNOSIS — R319 Hematuria, unspecified: Secondary | ICD-10-CM

## 2024-07-24 ENCOUNTER — Encounter: Payer: Self-pay | Admitting: Urology

## 2024-07-24 ENCOUNTER — Ambulatory Visit: Admitting: Urology

## 2024-07-24 VITALS — BP 124/72 | HR 54 | Ht 67.0 in | Wt 159.0 lb

## 2024-07-24 DIAGNOSIS — R310 Gross hematuria: Secondary | ICD-10-CM | POA: Diagnosis not present

## 2024-07-24 DIAGNOSIS — R8290 Unspecified abnormal findings in urine: Secondary | ICD-10-CM | POA: Diagnosis not present

## 2024-07-24 NOTE — Progress Notes (Signed)
 Assessment: 1. Gross hematuria   2. Abnormal urine findings     Plan: I personally reviewed the CT study from 4/25 with results as noted below. I reviewed recent lab results. Resolve Mdx urine culture sent today. Schedule for cystoscopy in the next 7-10 days - will postpone if urine culture is positive  Chief Complaint:  Chief Complaint  Patient presents with   Hematuria    History of Present Illness:  Austin Ramirez. is a 73 y.o. male who is seen for further evaluation of gross hematuria. At his initial visit in April 2025, he reported passing some small clots approximately 2 weeks prior to his visit.  On 12/24/2023, he passed a large clot and had associated gross hematuria.  The gross hematuria resolved.  No other urinary symptoms. He reported some baseline frequency, urgency, and nocturia x 2. IPSS = 8/2.  Laboratory evaluation: U/A 12/18/23: 11-20 RBCs Urine culture: No growth U/A 12/25/23: Negative PSA 12/25/2023: 1.5  CT hematuria study from 01/15/2024 showed no renal or ureteral calculi, no evidence of renal mass or obstruction. Further evaluation with cystoscopy was recommended but the patient elected not to proceed.  No tobacco use.  No history of kidney stones or UTIs.  He has recently had a recurrence of his gross hematuria.  His symptoms began approximately 2 weeks ago.  He has noted gross hematuria with associated clots.  He has had some discomfort with initiation of voiding.  He has continued symptoms of frequency and urgency.  No flank or abdominal pain.  No fevers or chills.  His urine has visibly cleared within the past 36 hours. Urine culture from 07/16/2024 showed no growth. He was treated with Bactrim DS which he completed yesterday.  Portions of the above documentation were copied from a prior visit for review purposes only.  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 No pertinent surgical history.  Allergies:  Allergies  Allergen Reactions   Almond (Diagnostic)     Other Reaction(s): Unknown   Almond Meal (Obsolete) Swelling    Family History:  Family History  Problem Relation Age of Onset   Arthritis Mother    Heart disease Mother    Arthritis Father    Heart disease Father    Hypertension Father    ADD / ADHD Neg Hx     Social History:  Social History   Tobacco Use   Smoking status: Never   Smokeless tobacco: Never  Vaping Use   Vaping status: Never Used  Substance Use Topics   Alcohol use: Yes    Alcohol/week: 0.0 standard drinks of alcohol   Drug use: No    ROS: Constitutional:  Negative for fever, chills, weight loss CV: Negative for chest pain, previous MI, hypertension Respiratory:  Negative for shortness of breath, wheezing, sleep apnea, frequent cough GI:  Negative for nausea, vomiting, bloody stool, GERD  Physical  exam: BP 124/72   Pulse (!) 54   Ht 5' 7 (1.702 m)   Wt 159 lb (72.1 kg)   BMI 24.90 kg/m  GENERAL APPEARANCE:  Well appearing, well developed, well nourished, NAD HEENT:  Atraumatic, normocephalic, oropharynx clear NECK:  Supple without lymphadenopathy or thyromegaly ABDOMEN:  Soft, non-tender, no masses EXTREMITIES:  Moves all extremities well, without clubbing, cyanosis, or edema NEUROLOGIC:  Alert and oriented x 3, normal gait, CN II-XII grossly intact MENTAL STATUS:  appropriate BACK:  Non-tender to palpation, No CVAT SKIN:  Warm, dry,  and intact  Results: U/A: yellow, >30 WBCs, >30 RBCs, few bacteria, nitrite negative

## 2024-07-25 LAB — MICROSCOPIC EXAMINATION
RBC, Urine: >30 /HPF — AB (ref 0–2)
WBC, UA: >30 /HPF — AB (ref 0–5)

## 2024-07-25 LAB — URINALYSIS, ROUTINE W REFLEX MICROSCOPIC
Glucose, UA: NEGATIVE
Nitrite, UA: NEGATIVE
Specific Gravity, UA: 1.02 (ref 1.005–1.030)
Urobilinogen, Ur: 1 mg/dL (ref 0.2–1.0)
pH, UA: 6.5 (ref 5.0–7.5)

## 2024-07-29 ENCOUNTER — Encounter: Payer: Self-pay | Admitting: Urology

## 2024-08-02 ENCOUNTER — Ambulatory Visit: Admitting: Urology

## 2024-08-02 VITALS — BP 137/95 | HR 55 | Ht 67.0 in | Wt 160.0 lb

## 2024-08-02 DIAGNOSIS — R310 Gross hematuria: Secondary | ICD-10-CM | POA: Diagnosis not present

## 2024-08-02 DIAGNOSIS — N329 Bladder disorder, unspecified: Secondary | ICD-10-CM

## 2024-08-02 LAB — URINALYSIS, ROUTINE W REFLEX MICROSCOPIC
Bilirubin, UA: NEGATIVE
Glucose, UA: NEGATIVE
Ketones, UA: NEGATIVE
Leukocytes,UA: NEGATIVE
Nitrite, UA: NEGATIVE
Protein,UA: NEGATIVE
Specific Gravity, UA: 1.01 (ref 1.005–1.030)
Urobilinogen, Ur: 0.2 mg/dL (ref 0.2–1.0)
pH, UA: 6 (ref 5.0–7.5)

## 2024-08-02 LAB — MICROSCOPIC EXAMINATION

## 2024-08-02 MED ORDER — CIPROFLOXACIN HCL 500 MG PO TABS
500.0000 mg | ORAL_TABLET | Freq: Once | ORAL | Status: AC
  Administered 2024-08-02: 500 mg via ORAL

## 2024-08-02 NOTE — H&P (View-Only) (Signed)
 Assessment: 1. Gross hematuria   2. Lesion of bladder    Plan: Urine cytology sent today. Cipro x 1 following cystoscopy I discussed the findings with the patient and his wife today. Recommend further management with cystoscopy, transurethral resection of bladder tumor, possible insert of left ureteral stent, and instillation of intravesical chemotherapy.  The procedure including potential risk discussed with the patient in detail.  He understands wishes to proceed.  Procedure: The patient will be scheduled for cystoscopy, bilateral retrograde pyelograms, transurethral resection of bladder tumor, possible instillation of intravesical chemotherapy at [facility].  Surgical request is placed with the surgery schedulers and will be scheduled at the patient's/family request. Informed consent is given as documented below. Anesthesia: General  The patient does not have sleep apnea, history of MRSA, history of VRE, history of cardiac device requiring special anesthetic needs. Patient is stable and considered clear for surgical management in an outpatient ambulatory surgery setting as well as inpatient hospital setting.  Consent for Operation or Procedure: Provider Certification I hereby certify that the nature, purpose, benefits, usual and most frequent risks of, and alternatives to, the operation or procedure have been explained to the patient (or person authorized to sign for the patient) either by me as responsible physician or by the provider who is to perform the operation or procedure. Time spent such that the patient/family has had an opportunity to ask questions, and that those questions have been answered. The patient or the patient's representative has been advised that selected tasks may be performed by assistants to the primary health care provider(s). I believe that the patient (or person authorized to sign for the patient) understands what has been explained, and has consented to the  operation or procedure. No guarantees were implied or made.   Chief Complaint:  Chief Complaint  Patient presents with   Cysto    History of Present Illness:  Austin Yawn. is a 73 y.o. male who is seen for further evaluation of gross hematuria. At his initial visit in April 2025, he reported passing some small clots approximately 2 weeks prior to his visit.  On 12/24/2023, he passed a large clot and had associated gross hematuria.  The gross hematuria resolved.  No other urinary symptoms. He reported some baseline frequency, urgency, and nocturia x 2. IPSS = 8/2.  Laboratory evaluation: U/A 12/18/23: 11-20 RBCs Urine culture: No growth U/A 12/25/23: Negative PSA 12/25/2023: 1.5  CT hematuria study from 01/15/2024 showed no renal or ureteral calculi, no evidence of renal mass or obstruction. Further evaluation with cystoscopy was recommended but the patient elected not to proceed.  No tobacco use.  No history of kidney stones or UTIs.  He has recently had a recurrence of his gross hematuria.  His symptoms began approximately 3 weeks ago.  He noted gross hematuria with associated clots.  He  had some discomfort with initiation of voiding.  He had continued symptoms of frequency and urgency.  No flank or abdominal pain.  No fevers or chills.  His urine visibly cleared prior to his visit on 07/24/24. Urine culture from 07/16/2024 showed no growth. He was treated with Bactrim DS. U/A 07/24/24: >30 WBCs, >30 RBCs Resolve Mdx culture: no growth  He presents today for cystoscopy. He has not had further gross hematuria.  No dysuria or flank pain.  Portions of the above documentation were copied from a prior visit for review purposes only.  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  Allergies:  Allergies  Allergen Reactions   Almond (Diagnostic)     Other Reaction(s): Unknown   Almond Meal (Obsolete) Swelling    Family History:  Family History  Problem Relation Age of Onset   Arthritis Mother    Heart disease Mother    Arthritis Father    Heart disease Father    Hypertension Father    ADD / ADHD Neg Hx     Social History:  Social History   Tobacco Use   Smoking status: Never   Smokeless tobacco: Never  Vaping Use   Vaping status: Never Used  Substance Use Topics   Alcohol use: Yes    Alcohol/week: 0.0 standard drinks of alcohol   Drug use: No    ROS: Constitutional:  Negative for fever, chills, weight loss CV: Negative for chest pain, previous MI, hypertension Respiratory:  Negative for shortness of breath, wheezing, sleep apnea, frequent cough GI:  Negative for nausea, vomiting, bloody stool, GERD  Physical exam: BP (!) 137/95   Pulse (!) 55   Ht 5' 7 (1.702 m)   Wt 160 lb (72.6 kg)   BMI 25.06 kg/m  GENERAL APPEARANCE:  Well appearing, well developed, well nourished, NAD HEENT:  Atraumatic, normocephalic, oropharynx clear NECK:  Supple without lymphadenopathy or thyromegaly ABDOMEN:  Soft, non-tender, no masses EXTREMITIES:  Moves all extremities well, without clubbing, cyanosis, or edema NEUROLOGIC:  Alert and oriented x 3, normal gait, CN II-XII grossly intact MENTAL STATUS:  appropriate BACK:  Non-tender to palpation, No CVAT SKIN:  Warm, dry, and intact  Results: U/A: 0-5 WBCs, 3-10 RBCs  Procedure:  Flexible Cystourethroscopy  Pre-operative Diagnosis: Gross  hematuria  Post-operative Diagnosis: Gross hematuria  Anesthesia:  local with lidocaine jelly  Surgical Narrative:  After appropriate informed consent was obtained, the patient was prepped and draped in the usual sterile fashion in the supine position.  The patient was correctly identified and the proper procedure delineated prior to proceeding.  Sterile lidocaine gel was instilled in the urethra. The flexible cystoscope was introduced without difficulty.  Findings:  Anterior urethra: Normal  Posterior urethra: Lateral lobe hypertrophy  Bladder: papillary mucosal changes on left lateral wall extending toward trigone  Ureteral orifices: not visualized due to patient discomfort with procedure  Additional findings: none  Saline bladder wash for cytology was performed.    The cystoscope was then removed.  The patient tolerated the procedure well.

## 2024-08-02 NOTE — Progress Notes (Signed)
 Assessment: 1. Gross hematuria   2. Lesion of bladder    Plan: Urine cytology sent today. Cipro x 1 following cystoscopy I discussed the findings with the patient and his wife today. Recommend further management with cystoscopy, transurethral resection of bladder tumor, possible insert of left ureteral stent, and instillation of intravesical chemotherapy.  The procedure including potential risk discussed with the patient in detail.  He understands wishes to proceed.  Procedure: The patient will be scheduled for cystoscopy, bilateral retrograde pyelograms, transurethral resection of bladder tumor, possible instillation of intravesical chemotherapy at [facility].  Surgical request is placed with the surgery schedulers and will be scheduled at the patient's/family request. Informed consent is given as documented below. Anesthesia: General  The patient does not have sleep apnea, history of MRSA, history of VRE, history of cardiac device requiring special anesthetic needs. Patient is stable and considered clear for surgical management in an outpatient ambulatory surgery setting as well as inpatient hospital setting.  Consent for Operation or Procedure: Provider Certification I hereby certify that the nature, purpose, benefits, usual and most frequent risks of, and alternatives to, the operation or procedure have been explained to the patient (or person authorized to sign for the patient) either by me as responsible physician or by the provider who is to perform the operation or procedure. Time spent such that the patient/family has had an opportunity to ask questions, and that those questions have been answered. The patient or the patient's representative has been advised that selected tasks may be performed by assistants to the primary health care provider(s). I believe that the patient (or person authorized to sign for the patient) understands what has been explained, and has consented to the  operation or procedure. No guarantees were implied or made.   Chief Complaint:  Chief Complaint  Patient presents with   Cysto    History of Present Illness:  Austin Ramirez. is a 73 y.o. male who is seen for further evaluation of gross hematuria. At his initial visit in April 2025, he reported passing some small clots approximately 2 weeks prior to his visit.  On 12/24/2023, he passed a large clot and had associated gross hematuria.  The gross hematuria resolved.  No other urinary symptoms. He reported some baseline frequency, urgency, and nocturia x 2. IPSS = 8/2.  Laboratory evaluation: U/A 12/18/23: 11-20 RBCs Urine culture: No growth U/A 12/25/23: Negative PSA 12/25/2023: 1.5  CT hematuria study from 01/15/2024 showed no renal or ureteral calculi, no evidence of renal mass or obstruction. Further evaluation with cystoscopy was recommended but the patient elected not to proceed.  No tobacco use.  No history of kidney stones or UTIs.  He has recently had a recurrence of his gross hematuria.  His symptoms began approximately 3 weeks ago.  He noted gross hematuria with associated clots.  He  had some discomfort with initiation of voiding.  He had continued symptoms of frequency and urgency.  No flank or abdominal pain.  No fevers or chills.  His urine visibly cleared prior to his visit on 07/24/24. Urine culture from 07/16/2024 showed no growth. He was treated with Bactrim DS. U/A 07/24/24: >30 WBCs, >30 RBCs Resolve Mdx culture: no growth  He presents today for cystoscopy. He has not had further gross hematuria.  No dysuria or flank pain.  Portions of the above documentation were copied from a prior visit for review purposes only.  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  Allergies:  Allergies  Allergen Reactions   Almond (Diagnostic)     Other Reaction(s): Unknown   Almond Meal (Obsolete) Swelling    Family History:  Family History  Problem Relation Age of Onset   Arthritis Mother    Heart disease Mother    Arthritis Father    Heart disease Father    Hypertension Father    ADD / ADHD Neg Hx     Social History:  Social History   Tobacco Use   Smoking status: Never   Smokeless tobacco: Never  Vaping Use   Vaping status: Never Used  Substance Use Topics   Alcohol use: Yes    Alcohol/week: 0.0 standard drinks of alcohol   Drug use: No    ROS: Constitutional:  Negative for fever, chills, weight loss CV: Negative for chest pain, previous MI, hypertension Respiratory:  Negative for shortness of breath, wheezing, sleep apnea, frequent cough GI:  Negative for nausea, vomiting, bloody stool, GERD  Physical exam: BP (!) 137/95   Pulse (!) 55   Ht 5' 7 (1.702 m)   Wt 160 lb (72.6 kg)   BMI 25.06 kg/m  GENERAL APPEARANCE:  Well appearing, well developed, well nourished, NAD HEENT:  Atraumatic, normocephalic, oropharynx clear NECK:  Supple without lymphadenopathy or thyromegaly ABDOMEN:  Soft, non-tender, no masses EXTREMITIES:  Moves all extremities well, without clubbing, cyanosis, or edema NEUROLOGIC:  Alert and oriented x 3, normal gait, CN II-XII grossly intact MENTAL STATUS:  appropriate BACK:  Non-tender to palpation, No CVAT SKIN:  Warm, dry, and intact  Results: U/A: 0-5 WBCs, 3-10 RBCs  Procedure:  Flexible Cystourethroscopy  Pre-operative Diagnosis: Gross  hematuria  Post-operative Diagnosis: Gross hematuria  Anesthesia:  local with lidocaine jelly  Surgical Narrative:  After appropriate informed consent was obtained, the patient was prepped and draped in the usual sterile fashion in the supine position.  The patient was correctly identified and the proper procedure delineated prior to proceeding.  Sterile lidocaine gel was instilled in the urethra. The flexible cystoscope was introduced without difficulty.  Findings:  Anterior urethra: Normal  Posterior urethra: Lateral lobe hypertrophy  Bladder: papillary mucosal changes on left lateral wall extending toward trigone  Ureteral orifices: not visualized due to patient discomfort with procedure  Additional findings: none  Saline bladder wash for cytology was performed.    The cystoscope was then removed.  The patient tolerated the procedure well.

## 2024-08-05 ENCOUNTER — Ambulatory Visit: Payer: Self-pay | Admitting: Urology

## 2024-08-05 DIAGNOSIS — R310 Gross hematuria: Secondary | ICD-10-CM

## 2024-08-05 DIAGNOSIS — N329 Bladder disorder, unspecified: Secondary | ICD-10-CM

## 2024-08-05 NOTE — Progress Notes (Signed)
 Sent message, via epic in basket, requesting orders in epic from Careers adviser.

## 2024-08-09 NOTE — Patient Instructions (Addendum)
 SURGICAL WAITING ROOM VISITATION  Patients having surgery or a procedure may have no more than 2 support people in the waiting area - these visitors may rotate.    Children under the age of 74 must have an adult with them who is not the patient.  Visitors with respiratory illnesses are discouraged from visiting and should remain at home.  If the patient needs to stay at the hospital during part of their recovery, the visitor guidelines for inpatient rooms apply. Pre-op nurse will coordinate an appropriate time for 1 support person to accompany patient in pre-op.  This support person may not rotate.    Please refer to the [facility] website for the visitor guidelines for Inpatients (after your surgery is over and you are in a regular room).       Your procedure is scheduled on:  08/13/2024    Report to [facility] Main Entrance    Report to admitting at  1230 pm     Call this number if you have problems the morning of surgery [phone]   Do not eat food :After Midnight.   After Midnight you may have the following liquids until ___ 1130___ AM/ DAY OF SURGERY  Water Non-Citrus Juices (without pulp, NO RED-Apple, White grape, White cranberry) Black Coffee (NO MILK/CREAM OR CREAMERS, sugar ok)  Clear Tea (NO MILK/CREAM OR CREAMERS, sugar ok) regular and decaf                             Plain Jell-O (NO RED)                                           Fruit ices (not with fruit pulp, NO RED)                                     Popsicles (NO RED)                                                               Sports drinks like Gatorade (NO RED)                          If you have questions, please contact your surgeon's office.      Oral Hygiene is also important to reduce your risk of infection.                                    Remember - BRUSH YOUR TEETH THE MORNING OF SURGERY WITH YOUR REGULAR TOOTHPASTE  DENTURES WILL BE REMOVED PRIOR TO SURGERY PLEASE DO NOT  APPLY Poly grip OR ADHESIVES!!!   Do NOT smoke after Midnight   Stop all vitamins and herbal supplements 7 days before surgery.   Take these medicines the morning of surgery with A SIP OF WATER:   none   DO NOT TAKE ANY ORAL DIABETIC MEDICATIONS DAY OF YOUR SURGERY  Bring CPAP mask and tubing day  of surgery.                              You may not have any metal on your body including hair pins, jewelry, and body piercing             Do not wear make-up, lotions, powders, perfumes/cologne, or deodorant  Do not wear nail polish including gel and S&S, artificial/acrylic nails, or any other type of covering on natural nails including finger and toenails. If you have artificial nails, gel coating, etc. that needs to be removed by a nail salon please have this removed prior to surgery or surgery may need to be canceled/ delayed if the surgeon/ anesthesia feels like they are unable to be safely monitored.   Do not shave  48 hours prior to surgery.               Men may shave face and neck.   Do not bring valuables to the hospital. [facility] IS NOT             RESPONSIBLE   FOR VALUABLES.   Contacts, glasses, dentures or bridgework may not be worn into surgery.   Bring small overnight bag day of surgery.   DO NOT BRING YOUR HOME MEDICATIONS TO THE HOSPITAL. PHARMACY WILL DISPENSE MEDICATIONS LISTED ON YOUR MEDICATION LIST TO YOU DURING YOUR ADMISSION IN THE HOSPITAL!    Patients discharged on the day of surgery will not be allowed to drive home.  Someone NEEDS to stay with you for the first 24 hours after anesthesia.   Special Instructions: Bring a copy of your healthcare power of attorney and living will documents the day of surgery if you haven't scanned them before.              Please read over the following fact sheets you were given: IF YOU HAVE QUESTIONS ABOUT YOUR PRE-OP INSTRUCTIONS PLEASE CALL [phone].   If you received a COVID test during your pre-op visit  it is  requested that you wear a mask when out in public, stay away from anyone that may not be feeling well and notify your surgeon if you develop symptoms. If you test positive for Covid or have been in contact with anyone that has tested positive in the last 10 days please notify you surgeon.    [facility] - Preparing for Surgery Before surgery, you can play an important role.  Because skin is not sterile, your skin needs to be as free of germs as possible.  You can reduce the number of germs on your skin by washing with CHG (chlorahexidine gluconate) soap before surgery.  CHG is an antiseptic cleaner which kills germs and bonds with the skin to continue killing germs even after washing. Please DO NOT use if you have an allergy to CHG or antibacterial soaps.  If your skin becomes reddened/irritated stop using the CHG and inform your nurse when you arrive at Short Stay. Do not shave (including legs and underarms) for at least 48 hours prior to the first CHG shower.  You may shave your face/neck. Please follow these instructions carefully:  1.  Shower with CHG Soap the night before surgery and the  morning of Surgery.  2.  If you choose to wash your hair, wash your hair first as usual with your  normal  shampoo.  3.  After you shampoo, rinse your hair and body thoroughly  to remove the  shampoo.                           4.  Use CHG as you would any other liquid soap.  You can apply chg directly  to the skin and wash                       Gently with a scrungie or clean washcloth.  5.  Apply the CHG Soap to your body ONLY FROM THE NECK DOWN.   Do not use on face/ open                           Wound or open sores. Avoid contact with eyes, ears mouth and genitals (private parts).                       Wash face,  Genitals (private parts) with your normal soap.             6.  Wash thoroughly, paying special attention to the area where your surgery  will be performed.  7.  Thoroughly rinse your body with warm  water from the neck down.  8.  DO NOT shower/wash with your normal soap after using and rinsing off  the CHG Soap.                9.  Pat yourself dry with a clean towel.            10.  Wear clean pajamas.            11.  Place clean sheets on your bed the night of your first shower and do not  sleep with pets. Day of Surgery : Do not apply any lotions/deodorants the morning of surgery.  Please wear clean clothes to the hospital/surgery center.  FAILURE TO FOLLOW THESE INSTRUCTIONS MAY RESULT IN THE CANCELLATION OF YOUR SURGERY PATIENT SIGNATURE_________________________________  NURSE SIGNATURE__________________________________  ________________________________________________________________________

## 2024-08-09 NOTE — Progress Notes (Addendum)
 Anesthesia Review:  ERE:Ejinwij Douglass , NP ARNETTA 07/16/24  Harlene Horton- PCP  Cardiologist : Lorrene Benders - LOV 2021 nofurther workup needed per pt   PPM/ ICD: Device Orders: Rep Notified:  Chest x-ray : EKG : 08/12/2024  Echo : 2017  Stress test: Cardiac Cath :   Activity level:can do a flight of stairs wtihout difficutly   Sleep Study/ CPAP : Fasting Blood Sugar :      / Checks Blood Sugar -- times a day:    Blood Thinner/ Instructions /Last Dose: ASA / Instructions/ Last Dose :    PT came into preop appt on 08/12/2024 stating he needs questions answered prior to surgery.  The questions he asked were about risks, etc and he is not sure he wants to have surgery and may seek 2nd opinion.  Wife with pt at time of preop appt.  Pt informed that those questions needed to be answered by MD.  PT aware that he needs to call surgeon office upon departure and tell them he needs to speak with surgeon prior to surgery tomorrow.  Both pt and wife voice understanding.  PT states he will probably seed second opinion.  Consent form not signed.  Pt was 30 minutes late for preop appt.  PT states he attemtped to call surgeon office this am and registratoin  told he he needed to speak with preop .  Med hx completed along with instructions and labs of cbc, bmp and EKG done.  PT to go and call surgeon.kelly Gekas aware.    Burnard Senna aware of EKG done at preop appt on 08/12/24.    Due to pt being 30 minutes late for preop and many quesitons for MD only med hx completed along with instructions     AFter pt left preop appt he called surgeon office and in their note it states he stated he does not accept blood products.  Blood Refusal consent form placed on chart for pt to sign DOS.

## 2024-08-12 ENCOUNTER — Telehealth: Payer: Self-pay

## 2024-08-12 ENCOUNTER — Encounter (HOSPITAL_COMMUNITY): Payer: Self-pay

## 2024-08-12 ENCOUNTER — Encounter (HOSPITAL_COMMUNITY)
Admission: RE | Admit: 2024-08-12 | Discharge: 2024-08-12 | Disposition: A | Source: Ambulatory Visit | Attending: Urology | Admitting: Urology

## 2024-08-12 ENCOUNTER — Other Ambulatory Visit: Payer: Self-pay

## 2024-08-12 VITALS — BP 160/93 | HR 45 | Temp 98.5°F | Resp 16 | Ht 67.0 in | Wt 159.8 lb

## 2024-08-12 DIAGNOSIS — J45909 Unspecified asthma, uncomplicated: Secondary | ICD-10-CM | POA: Insufficient documentation

## 2024-08-12 DIAGNOSIS — R310 Gross hematuria: Secondary | ICD-10-CM | POA: Insufficient documentation

## 2024-08-12 DIAGNOSIS — N329 Bladder disorder, unspecified: Secondary | ICD-10-CM | POA: Diagnosis not present

## 2024-08-12 DIAGNOSIS — I440 Atrioventricular block, first degree: Secondary | ICD-10-CM | POA: Diagnosis not present

## 2024-08-12 DIAGNOSIS — Z8673 Personal history of transient ischemic attack (TIA), and cerebral infarction without residual deficits: Secondary | ICD-10-CM | POA: Insufficient documentation

## 2024-08-12 DIAGNOSIS — R001 Bradycardia, unspecified: Secondary | ICD-10-CM | POA: Insufficient documentation

## 2024-08-12 DIAGNOSIS — Z01818 Encounter for other preprocedural examination: Secondary | ICD-10-CM | POA: Diagnosis present

## 2024-08-12 HISTORY — DX: Nonrheumatic mitral (valve) insufficiency: I34.0

## 2024-08-12 HISTORY — DX: Unspecified asthma, uncomplicated: J45.909

## 2024-08-12 HISTORY — DX: Nonrheumatic mitral (valve) prolapse: I34.1

## 2024-08-12 HISTORY — DX: Cerebral infarction, unspecified: I63.9

## 2024-08-12 LAB — CBC
HCT: 37.9 % — ABNORMAL LOW (ref 39.0–52.0)
Hemoglobin: 12.4 g/dL — ABNORMAL LOW (ref 13.0–17.0)
MCH: 31.1 pg (ref 26.0–34.0)
MCHC: 32.7 g/dL (ref 30.0–36.0)
MCV: 95 fL (ref 80.0–100.0)
Platelets: 218 K/uL (ref 150–400)
RBC: 3.99 MIL/uL — ABNORMAL LOW (ref 4.22–5.81)
RDW: 15.1 % (ref 11.5–15.5)
WBC: 7 K/uL (ref 4.0–10.5)
nRBC: 0 % (ref 0.0–0.2)

## 2024-08-12 LAB — BASIC METABOLIC PANEL WITH GFR
Anion gap: 9 (ref 5–15)
BUN: 15 mg/dL (ref 8–23)
CO2: 25 mmol/L (ref 22–32)
Calcium: 9.4 mg/dL (ref 8.9–10.3)
Chloride: 105 mmol/L (ref 98–111)
Creatinine, Ser: 0.91 mg/dL (ref 0.61–1.24)
GFR, Estimated: >60 mL/min (ref >60)
Glucose, Bld: 89 mg/dL (ref 70–99)
Potassium: 3.9 mmol/L (ref 3.5–5.1)
Sodium: 139 mmol/L (ref 135–145)

## 2024-08-12 NOTE — Progress Notes (Signed)
 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  Seen by Cardiology in [place]  in 2021. TTE obtained in 01/2020 showed normal LVEF, Grade 2 DD, and MVP with severe MR. He had follow up TEE showing normal LVEF with MVP and moderate MR. It appears he's been lost to f/u.   Patient reports at PAT visit he can ride a bike with no cardiac symptoms. He is a marathon runner.  Discussed with Dr. Maryclare. Ok to proceed if patient has good functional status.  VS: BP (!) 160/93   Pulse (!) 45   Temp 36.9 C (Oral)   Resp 16   Ht 5' 7 (1.702 m)   Wt 72.5 kg   SpO2 100%   BMI 25.03 kg/m   PROVIDERS: Domenica Harlene LABOR, MD   LABS: Labs reviewed: Acceptable for surgery. (all labs ordered are listed, but only abnormal results are displayed)  Labs Reviewed  CBC - Abnormal; Notable for the following components:      Result Value   RBC 3.99 (*)    Hemoglobin 12.4 (*)    HCT 37.9 (*)    All other components within normal limits  BASIC METABOLIC PANEL WITH GFR     IMAGES:   EKG 08/02/24:  Sinus bradycardia with 1st degree A-V block Left axis deviation Abnormal ECG No previous ECGs available  TEE 02/27/2020 [facility]):  Impressions 02/27/2020 3:34 PM MDT  Normal left ventricular size and function with an ejection fraction of 60% by visual estimate. Mildly dilated left atrium.  Normal left atrial appendage without evidence of thrombus. Mitral valve leaflets are thickened.  There is bileaflet  mitral valve prolapse.  There is moderate late-systolic mitral regurgitation.  Pulmonary venous flow is normal in all 4 pulmonary veins.  There is no evidence of severe mitral insufficiency. Calcified trileaflet aortic valve with reduced leaflet excursion.   Mild aortic insufficiency.   TTE 02/17/2020 [facility]):  Conclusions  Summary Normal left ventricular size with mild concentric hypertrophy. Normal left ventricular systolic function (EF visually estimated at ~65%). Grade 2 diastolic dysfunction (increased left atrial pressure). Moderate left atrial enlargement. Bileaflet mitral valve prolapse with severe eccentric regurgitation. Pulmonary vein flow reversal is noted. Moderate aortic valve stenosis with a mean gradient of 24 mmHg, peak of 41 mmHg, peak velocity 3.2 m/s and AVA 1.2 cm2. Mild aortic valve regurgitation. Moderate tricuspid valve regurgitation. Mildly increased right ventricular systolic pressure (43 mmHg) assuming the right atrial pressure ~ 3 mmHg. Mildly enlarged ascending aorta at 4.0 cm.  Compared to echo dated 06/26/2018, was a limited bubble study and did not report significant findings regarding the valves, chamber dimensions, or RVSP.   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  MEDICATIONS:  carboxymethylcellulose (REFRESH PLUS) 0.5 % SOLN   clobetasol  cream (TEMOVATE ) 0.05 %   DHEA 10 MG CAPS   Multiple  Vitamin (MULTIVITAMIN) tablet   multivitamin-lutein (OCUVITE-LUTEIN) CAPS capsule   Omega-3 Fatty Acids (FISH OIL PO)   sulfamethoxazole-trimethoprim (BACTRIM DS) 800-160 MG tablet   vitamin E 45 MG (100 UNITS) capsule   No current facility-administered medications for this encounter.    Burnard CHRISTELLA Odis DEVONNA [facility] Short Stay/Anesthesiology [facility] Phone [phone] 08/12/2024 2:30 PM

## 2024-08-12 NOTE — Anesthesia Preprocedure Evaluation (Signed)
 Anesthesia Evaluation  Patient identified by MRN, date of birth, ID band Patient awake    Reviewed: Allergy & Precautions, NPO status , Patient's Chart, lab work & pertinent test results  Airway Mallampati: III  TM Distance: >3 FB Neck ROM: Full    Dental  (+) Teeth Intact, Dental Advisory Given   Pulmonary neg shortness of breath, asthma , neg sleep apnea, neg COPD, neg recent URI   breath sounds clear to auscultation       Cardiovascular negative cardio ROS  Rhythm:Regular     Neuro/Psych  Headaches CVA, Residual Symptoms    GI/Hepatic Neg liver ROS,GERD  Controlled,,  Endo/Other  negative endocrine ROS    Renal/GU Lab Results      Component                Value               Date                      NA                       139                 08/12/2024                K                        3.9                 08/12/2024                CO2                      25                  08/12/2024                GLUCOSE                  89                  08/12/2024                BUN                      15                  08/12/2024                CREATININE               0.91                08/12/2024                CALCIUM                  9.4                 08/12/2024                GFR                      83.74               12/25/2023  GFRNONAA                 >60                 08/12/2024                Musculoskeletal negative musculoskeletal ROS (+)    Abdominal   Peds  Hematology  (+) Blood dyscrasia, anemia , REFUSES BLOOD PRODUCTSLab Results      Component                Value               Date                      WBC                      7.0                 08/12/2024                HGB                      12.4 (L)            08/12/2024                HCT                      37.9 (L)            08/12/2024                MCV                      95.0                08/12/2024                 PLT                      218                 08/12/2024              Anesthesia Other Findings   Reproductive/Obstetrics                              Anesthesia Physical Anesthesia Plan  ASA: 2  Anesthesia Plan: General   Post-op Pain Management:    Induction: Intravenous  PONV Risk Score and Plan: 2 and Ondansetron  and Dexamethasone  Airway Management Planned: Oral ETT  Additional Equipment: None  Intra-op Plan:   Post-operative Plan: Extubation in OR  Informed Consent: I have reviewed the patients History and Physical, chart, labs and discussed the procedure including the risks, benefits and alternatives for the proposed anesthesia with the patient or authorized representative who has indicated his/her understanding and acceptance.     Dental advisory given  Plan Discussed with: CRNA  Anesthesia Plan Comments: (See PAT note from 11/17)         Anesthesia Quick Evaluation

## 2024-08-12 NOTE — Telephone Encounter (Signed)
 Pt called with many questions about surgery for tomorrow. Pt asked what are the risk and benefits of surgery, why he hasn't heard from the anesthesiologist, wanted to make sure its note in his chart that he does not accept blood products, inquired about how many nurses would be in the procedure, inquired why he would need a stent placed during surgery, if he would have a catheter to go home with, etc. Reinforced with pt Dr. Roseann will see him tomorrow before surgery. Also reinforced with pt to take all advanced directives with him so he can present them to pre op nurses. Pt voiced understanding.

## 2024-08-13 ENCOUNTER — Encounter (HOSPITAL_COMMUNITY): Payer: Self-pay | Admitting: Urology

## 2024-08-13 ENCOUNTER — Encounter (HOSPITAL_COMMUNITY)
Admission: RE | Disposition: A | Discharge status: Home / Self Care | Payer: Self-pay | Source: Ambulatory Visit | Attending: Urology

## 2024-08-13 ENCOUNTER — Other Ambulatory Visit: Payer: Self-pay

## 2024-08-13 ENCOUNTER — Ambulatory Visit (HOSPITAL_COMMUNITY): Admitting: Medical

## 2024-08-13 ENCOUNTER — Other Ambulatory Visit: Payer: Self-pay | Admitting: Urology

## 2024-08-13 ENCOUNTER — Ambulatory Visit (HOSPITAL_COMMUNITY): Admitting: Certified Registered"

## 2024-08-13 ENCOUNTER — Ambulatory Visit (HOSPITAL_COMMUNITY)

## 2024-08-13 ENCOUNTER — Ambulatory Visit (HOSPITAL_COMMUNITY)
Admission: RE | Admit: 2024-08-13 | Discharge: 2024-08-13 | Disposition: A | Discharge status: Home / Self Care | Source: Ambulatory Visit | Attending: Urology | Admitting: Urology

## 2024-08-13 DIAGNOSIS — D494 Neoplasm of unspecified behavior of bladder: Secondary | ICD-10-CM | POA: Diagnosis not present

## 2024-08-13 DIAGNOSIS — Z79899 Other long term (current) drug therapy: Secondary | ICD-10-CM | POA: Diagnosis not present

## 2024-08-13 DIAGNOSIS — N329 Bladder disorder, unspecified: Secondary | ICD-10-CM | POA: Diagnosis present

## 2024-08-13 DIAGNOSIS — K219 Gastro-esophageal reflux disease without esophagitis: Secondary | ICD-10-CM | POA: Insufficient documentation

## 2024-08-13 DIAGNOSIS — R310 Gross hematuria: Secondary | ICD-10-CM | POA: Diagnosis present

## 2024-08-13 DIAGNOSIS — C679 Malignant neoplasm of bladder, unspecified: Secondary | ICD-10-CM

## 2024-08-13 DIAGNOSIS — Z01818 Encounter for other preprocedural examination: Secondary | ICD-10-CM

## 2024-08-13 HISTORY — PX: CYSTOSCOPY W/ RETROGRADES: SHX1426

## 2024-08-13 HISTORY — PX: BLADDER INSTILLATION: SHX6893

## 2024-08-13 HISTORY — PX: TRANSURETHRAL RESECTION OF BLADDER TUMOR: SHX2575

## 2024-08-13 LAB — NO BLOOD PRODUCTS

## 2024-08-13 SURGERY — TURBT (TRANSURETHRAL RESECTION OF BLADDER TUMOR)
Anesthesia: General

## 2024-08-13 MED ORDER — PROPOFOL 10 MG/ML IV BOLUS
INTRAVENOUS | Status: AC
  Filled 2024-08-13: qty 20

## 2024-08-13 MED ORDER — STERILE WATER FOR IRRIGATION IR SOLN
Status: DC | PRN
  Administered 2024-08-13: 500 mL

## 2024-08-13 MED ORDER — LIDOCAINE 20MG/ML (2%) 15 ML SYRINGE OPTIME
INTRAMUSCULAR | Status: DC | PRN
  Administered 2024-08-13: 80 mg via INTRAVENOUS

## 2024-08-13 MED ORDER — DEXAMETHASONE SOD PHOSPHATE PF 10 MG/ML IJ SOLN
INTRAMUSCULAR | Status: DC | PRN
  Administered 2024-08-13: 4 mg via INTRAVENOUS

## 2024-08-13 MED ORDER — GEMCITABINE CHEMO FOR BLADDER INSTILLATION 2000 MG
2000.0000 mg | Freq: Once | INTRAVENOUS | Status: AC
  Administered 2024-08-13: 2000 mg via INTRAVESICAL
  Filled 2024-08-13: qty 2000

## 2024-08-13 MED ORDER — ONDANSETRON HCL 4 MG/2ML IJ SOLN
INTRAMUSCULAR | Status: DC | PRN
  Administered 2024-08-13: 4 mg via INTRAVENOUS

## 2024-08-13 MED ORDER — ORAL CARE MOUTH RINSE: 15.0000 mL | Freq: Once | OROMUCOSAL | Status: AC

## 2024-08-13 MED ORDER — CEFAZOLIN SODIUM-DEXTROSE 2-4 GM/100ML-% IV SOLN
2.0000 g | INTRAVENOUS | Status: AC
  Administered 2024-08-13: 2 g via INTRAVENOUS
  Filled 2024-08-13: qty 100

## 2024-08-13 MED ORDER — CHLORHEXIDINE GLUCONATE 0.12 % MT SOLN
15.0000 mL | Freq: Once | OROMUCOSAL | Status: AC
  Administered 2024-08-13: 15 mL via OROMUCOSAL

## 2024-08-13 MED ORDER — PHENAZOPYRIDINE HCL 200 MG PO TABS: 200.0000 mg | ORAL_TABLET | Freq: Three times a day (TID) | ORAL | 0 refills | Status: AC | PRN

## 2024-08-13 MED ORDER — KETOROLAC TROMETHAMINE 30 MG/ML IJ SOLN
INTRAMUSCULAR | Status: AC
  Filled 2024-08-13: qty 1

## 2024-08-13 MED ORDER — IOHEXOL 300 MG/ML  SOLN
INTRAMUSCULAR | Status: DC | PRN
  Administered 2024-08-13: 15 mL

## 2024-08-13 MED ORDER — MIDAZOLAM HCL 2 MG/2ML IJ SOLN
INTRAMUSCULAR | Status: AC
Start: 2024-08-13 — End: 2024-08-13
  Filled 2024-08-13: qty 2

## 2024-08-13 MED ORDER — SODIUM CHLORIDE 0.9 % IR SOLN
Status: DC | PRN
  Administered 2024-08-13: 6000 mL via INTRAVESICAL

## 2024-08-13 MED ORDER — PHENAZOPYRIDINE HCL 200 MG PO TABS
200.0000 mg | ORAL_TABLET | Freq: Once | ORAL | Status: AC
  Administered 2024-08-13: 200 mg via ORAL

## 2024-08-13 MED ORDER — FENTANYL CITRATE (PF) 100 MCG/2ML IJ SOLN
INTRAMUSCULAR | Status: DC | PRN
  Administered 2024-08-13 (×2): 25 ug via INTRAVENOUS
  Administered 2024-08-13: 50 ug via INTRAVENOUS

## 2024-08-13 MED ORDER — FENTANYL CITRATE (PF) 50 MCG/ML IJ SOSY
PREFILLED_SYRINGE | INTRAMUSCULAR | Status: AC
  Filled 2024-08-13: qty 1

## 2024-08-13 MED ORDER — SUGAMMADEX SODIUM 200 MG/2ML IV SOLN
INTRAVENOUS | Status: DC | PRN
  Administered 2024-08-13: 200 mg via INTRAVENOUS

## 2024-08-13 MED ORDER — LACTATED RINGERS IV SOLN: INTRAVENOUS | Status: DC

## 2024-08-13 MED ORDER — PROPOFOL 10 MG/ML IV BOLUS
INTRAVENOUS | Status: DC | PRN
  Administered 2024-08-13: 30 mg via INTRAVENOUS
  Administered 2024-08-13: 100 mg via INTRAVENOUS
  Administered 2024-08-13: 125 ug/kg/min via INTRAVENOUS

## 2024-08-13 MED ORDER — FENTANYL CITRATE (PF) 100 MCG/2ML IJ SOLN
INTRAMUSCULAR | Status: AC
  Filled 2024-08-13: qty 2

## 2024-08-13 MED ORDER — SULFAMETHOXAZOLE-TRIMETHOPRIM 800-160 MG PO TABS: 1.0000 | ORAL_TABLET | Freq: Two times a day (BID) | ORAL | 0 refills | Status: AC

## 2024-08-13 MED ORDER — DEXMEDETOMIDINE HCL IN NACL 80 MCG/20ML IV SOLN
INTRAVENOUS | Status: DC | PRN
  Administered 2024-08-13: 12 ug via INTRAVENOUS

## 2024-08-13 MED ORDER — KETOROLAC TROMETHAMINE 30 MG/ML IJ SOLN
30.0000 mg | Freq: Once | INTRAMUSCULAR | Status: AC
  Administered 2024-08-13: 30 mg via INTRAVENOUS

## 2024-08-13 MED ORDER — PHENAZOPYRIDINE HCL 200 MG PO TABS
ORAL_TABLET | ORAL | Status: AC
  Filled 2024-08-13: qty 1

## 2024-08-13 MED ORDER — ROCURONIUM BROMIDE 100 MG/10ML IV SOLN
INTRAVENOUS | Status: DC | PRN
  Administered 2024-08-13: 70 mg via INTRAVENOUS

## 2024-08-13 MED ORDER — FENTANYL CITRATE (PF) 50 MCG/ML IJ SOSY
25.0000 ug | PREFILLED_SYRINGE | INTRAMUSCULAR | Status: DC | PRN
  Administered 2024-08-13 (×2): 25 ug via INTRAVENOUS
  Administered 2024-08-13: 50 ug via INTRAVENOUS

## 2024-08-13 SURGICAL SUPPLY — 18 items
BAG URINE DRAIN 2000ML AR STRL (UROLOGICAL SUPPLIES) IMPLANT
BAG URO CATCHER STRL LF (MISCELLANEOUS) ×2 IMPLANT
CATH FOLEY 2WAY SLVR 5CC 18FR (CATHETERS) IMPLANT
CATH URETL OPEN 5X70 (CATHETERS) IMPLANT
DRAPE FOOT SWITCH (DRAPES) ×2 IMPLANT
ELECT REM PT RETURN 15FT ADLT (MISCELLANEOUS) IMPLANT
EVACUATOR MICROVAS BLADDER (UROLOGICAL SUPPLIES) IMPLANT
GLOVE SURG LX STRL 8.0 MICRO (GLOVE) ×2 IMPLANT
GOWN STRL REUS W/ TWL XL LVL3 (GOWN DISPOSABLE) ×2 IMPLANT
GUIDEWIRE STRT TIP .038X150X3 (WIRE) IMPLANT
KIT TURNOVER KIT A (KITS) ×2 IMPLANT
LOOP CUT BIPOLAR 24F LRG (ELECTROSURGICAL) IMPLANT
MANIFOLD NEPTUNE II (INSTRUMENTS) ×2 IMPLANT
PACK CYSTO (CUSTOM PROCEDURE TRAY) ×2 IMPLANT
PAD PREP 24X48 CUFFED NSTRL (MISCELLANEOUS) ×2 IMPLANT
SYR 10ML LL (SYRINGE) IMPLANT
TUBING CONNECTING 10 (TUBING) ×2 IMPLANT
TUBING UROLOGY SET (TUBING) ×2 IMPLANT

## 2024-08-13 NOTE — Interval H&P Note (Signed)
 History and Physical Interval Note:  08/13/2024 2:16 PM  Austin Ramirez.  has presented today for surgery, with the diagnosis of Lesion of bladder,Gross hematuria.  The various methods of treatment have been discussed with the patient and family. After consideration of risks, benefits and other options for treatment, the patient has consented to  Procedure(s): TURBT (TRANSURETHRAL RESECTION OF BLADDER TUMOR) (N/A) INSTILLATION, BLADDER (N/A), bilateral retrograde pyelograms, possible insertion of left ureteral stent as a surgical intervention.  The patient's history has been reviewed, patient examined, no change in status, stable for surgery.  I have reviewed the patient's chart and labs.  Questions were answered to the patient's satisfaction.     Adine Manly

## 2024-08-13 NOTE — Op Note (Signed)
 OPERATIVE NOTE   Patient Name: Austin Ramirez.  MRN: [id number]   Date of Procedure: 08/13/24    Preoperative diagnosis:  Gross hematuria Bladder lesion  Postoperative diagnosis:  Gross hematuria Bladder lesion  Procedure:  Cystoscopy Bilateral retrograde pyelograms with intra-operative interpretation Transurethral resection of bladder tumor (3 cm) Instillation of intravesical chemotherapy (gemcitabine)  Attending: Adine DOROTHA Manly, MD  Anesthesia: General  Estimated blood loss: 10 ml  Fluids: Per anesthesia record  Drains: 23F foley to drainage  Specimens: Bladder tumor  Antibiotics: Ancef 2 gm IV  Findings:  - lateral lobe enlargement of prostate with elevated bladder neck - 3 cm area of abnormal mucosa with papillary changes on left lateral wall extending superiorly - no involvement of trigone - normal bilateral retrograde pyelograms  Indications:  73 year old male presents for surgical management of gross hematuria and bladder tumor.  He has a history of intermittent gross hematuria dating back to March 2025.  CT hematuria profile from April 2025 showed no renal or ureteral calculi, no evidence of mass or obstruction.  He did not elect to proceed with cystoscopy at that time.  He had a recurrence of his gross hematuria in October 2025.  This was associated with clots.  He also had symptoms of frequency and urgency.  Urine culture showed no growth.  He was evaluated with cystoscopy in the office on 08/02/2024.  This showed an area of abnormal mucosa with papillary change the left lateral wall.  Urine cytology is currently pending.  He presents now for cystoscopy, bilateral retrograde pyelograms, transurethral resection of bladder tumor, possible insertion of left ureteral stent, and instillation of intravesical chemotherapy.  The procedure including potential risk were discussed with the patient in detail.  He understands and wishes to proceed as  described.  Description of Procedure:  The patient was taken to the operating room suite and properly identified.  After successful induction of a general anesthetic, he was placed in the dorsolithotomy position.  The patient received IV Ancef.  The patient's genital area was prepped and draped in sterile fashion.  A preoperative timeout was performed.  Under direct visualization, a 22 French rigid cystoscope was passed through the urethra into the bladder.  The patient was noted to have lateral lobe enlargement of the prostate with elevation of the bladder neck.  Upon entering the bladder, careful inspection of the entire bladder was performed.  A normal-appearing trigone was seen with a single orifice bilaterally.  A 3 cm area of abnormal appearing mucosa with papillary change was noted on the left lateral wall of the bladder extending superiorly.  There was no involvement of the trigone or left ureteral orifice with the abnormal mucosa.  The remainder of the bladder was normal in appearance.  Bilateral retrograde pyelograms were performed for evaluation of the patient's upper tracts given his history of gross hematuria and bladder tumor.  Scout film showed a nonspecific bowel gas pattern with no obvious bony abnormalities.  Using a 5 French open-ended catheter, contrast was injected into the right ureter.  A normal ureter was appreciated without filling defect or evidence of obstruction.  In a similar fashion, contrast was injected to the left ureter.  No filling defect or evidence of obstruction was seen.  The urethra was dilated to 28 French using [employer].  The 57 French continuous-flow resectoscope sheath was placed under direct visualization.  Using the [facility] resectoscope and the gyrus loop, the bladder lesion was completely resected.  All abnormal tissue  was removed.  Hemostasis was obtained with the loop.  Care was taken to avoid any perforation of the bladder during resection.  Resected  tissue was irrigated from the bladder using an Ellik evacuator.  Inspection again demonstrated excellent hemostasis.  There was no evidence of any bladder perforation.  The cystoscope was removed.  An 31 French Foley catheter was placed with 10 mL of sterile water in the balloon.  The patient was then extubated and taken to the postanesthesia care unit in stable condition.  In the PACU, 2000 mg of gemcitabine was instilled into the bladder using the indwelling Foley catheter.  The gemcitabine was left in place for 30 minutes and the bladder was drained completely.  I personally supervised this portion of the procedure.  The patient tolerated this well.   Complications: None  Condition: Stable, extubated, transferred to PACU  Plan:  Discharge to home Return to office in 2 weeks to discuss pathology.

## 2024-08-13 NOTE — Anesthesia Procedure Notes (Signed)
 Procedure Name: Intubation Date/Time: 08/13/2024 3:10 PM  Performed by: Vincenzo Show, CRNAPre-anesthesia Checklist: Patient identified, Emergency Drugs available, Suction available, Patient being monitored and Timeout performed Patient Re-evaluated:Patient Re-evaluated prior to induction Oxygen Delivery Method: Circle system utilized Preoxygenation: Pre-oxygenation with 100% oxygen Induction Type: IV induction Ventilation: Mask ventilation without difficulty Laryngoscope Size: Mac and 3 Grade View: Grade I Tube type: Oral Tube size: 7.5 mm Number of attempts: 1 Airway Equipment and Method: Stylet Placement Confirmation: ETT inserted through vocal cords under direct vision, positive ETCO2, CO2 detector and breath sounds checked- equal and bilateral Secured at: 22 cm Tube secured with: Tape Dental Injury: Teeth and Oropharynx as per pre-operative assessment  Comments: ATOI

## 2024-08-13 NOTE — Transfer of Care (Signed)
 Immediate Anesthesia Transfer of Care Note  Patient: Austin Ramirez.  Procedure(s) Performed: Procedure(s): TURBT (TRANSURETHRAL RESECTION OF BLADDER TUMOR) (N/A) INSTILLATION, BLADDER (N/A) CYSTOSCOPY, WITH RETROGRADE PYELOGRAM (Bilateral)  Patient Location: PACU  Anesthesia Type:General  Level of Consciousness:  sedated, patient cooperative and responds to stimulation  Airway & Oxygen Therapy:Patient Spontanous Breathing and Patient connected to face mask oxgen  Post-op Assessment:  Report given to PACU RN and Post -op Vital signs reviewed and stable  Post vital signs:  Reviewed and stable  Last Vitals:  Vitals:   08/13/24 1436  BP: (!) 141/95  Pulse: (!) 44  Resp: 16  Temp: 36.7 C  SpO2: 98%    Complications: No apparent anesthesia complications

## 2024-08-13 NOTE — Anesthesia Postprocedure Evaluation (Signed)
 Anesthesia Post Note  Patient: Austin Ramirez.  Procedure(s) Performed: TURBT (TRANSURETHRAL RESECTION OF BLADDER TUMOR) INSTILLATION, BLADDER CYSTOSCOPY, WITH RETROGRADE PYELOGRAM (Bilateral)     Patient location during evaluation: PACU Anesthesia Type: General Level of consciousness: awake and alert Pain management: pain level controlled Vital Signs Assessment: post-procedure vital signs reviewed and stable Respiratory status: spontaneous breathing, nonlabored ventilation and respiratory function stable Cardiovascular status: blood pressure returned to baseline and stable Postop Assessment: no apparent nausea or vomiting Anesthetic complications: no   No notable events documented.  Last Vitals:  Vitals:   08/13/24 1815 08/13/24 1824  BP: 137/77 (!) 131/90  Pulse: (!) 49 (!) 55  Resp: 11   Temp: 36.4 C   SpO2: 97% 100%    Last Pain:  Vitals:   08/13/24 1815  TempSrc:   PainSc: 5                  Jhoanna Heyde

## 2024-08-14 ENCOUNTER — Encounter (HOSPITAL_COMMUNITY): Payer: Self-pay | Admitting: Urology

## 2024-08-14 ENCOUNTER — Encounter: Payer: Self-pay | Admitting: Urology

## 2024-08-14 LAB — UROVYSION FISH

## 2024-08-14 LAB — UROV MD:CYTOLOGY W/REFLEX FISH ATYPICAL CYTOLOGY

## 2024-08-15 ENCOUNTER — Ambulatory Visit: Payer: Self-pay | Admitting: Urology

## 2024-08-15 LAB — SURGICAL PATHOLOGY

## 2024-08-26 ENCOUNTER — Encounter: Payer: Self-pay | Admitting: Family Medicine

## 2024-08-27 ENCOUNTER — Telehealth: Payer: Self-pay

## 2024-08-27 DIAGNOSIS — Z1321 Encounter for screening for nutritional disorder: Secondary | ICD-10-CM

## 2024-08-27 DIAGNOSIS — R319 Hematuria, unspecified: Secondary | ICD-10-CM

## 2024-08-27 DIAGNOSIS — R351 Nocturia: Secondary | ICD-10-CM

## 2024-08-27 DIAGNOSIS — E782 Mixed hyperlipidemia: Secondary | ICD-10-CM

## 2024-08-27 NOTE — Telephone Encounter (Signed)
 Copied from CRM #[id number]. Topic: General - Other >> Aug 27, 2024  8:45 AM Carlyon D wrote: Reason for CRM: Pt wife Dotti is calling in regards to pt needing extra lab work done. Pt wife stated they messaged Dr. Domenica and was told he needs an appt with either dr blyth or miss yacopino as he has not been seen since 11/2023. Pt wife scheduled video visit with yaccopino for 12/5. Pt wife would like a call today to discuss lab orders and getting labs done else where with those orders, please reach out to wife at earliest convenience

## 2024-08-27 NOTE — Telephone Encounter (Signed)
 Returned wife's call and she was advised that appointment needs to be made for labs. She scheduled appointment on 08/28/2024 @ 9:30 AM. Wife was advised that pt needs to keep his appointment on Friday as well w/ Harlene Wheeler [facility].

## 2024-08-28 ENCOUNTER — Ambulatory Visit: Admitting: Urology

## 2024-08-28 ENCOUNTER — Encounter: Payer: Self-pay | Admitting: Urology

## 2024-08-28 ENCOUNTER — Other Ambulatory Visit

## 2024-08-28 VITALS — BP 156/90 | HR 50 | Ht 67.0 in | Wt 153.0 lb

## 2024-08-28 DIAGNOSIS — E782 Mixed hyperlipidemia: Secondary | ICD-10-CM | POA: Diagnosis not present

## 2024-08-28 DIAGNOSIS — R319 Hematuria, unspecified: Secondary | ICD-10-CM

## 2024-08-28 DIAGNOSIS — R310 Gross hematuria: Secondary | ICD-10-CM

## 2024-08-28 DIAGNOSIS — Z1321 Encounter for screening for nutritional disorder: Secondary | ICD-10-CM | POA: Diagnosis not present

## 2024-08-28 DIAGNOSIS — C672 Malignant neoplasm of lateral wall of bladder: Secondary | ICD-10-CM

## 2024-08-28 LAB — CBC WITH DIFFERENTIAL/PLATELET
Basophils Absolute: 0.1 K/uL (ref 0.0–0.1)
Basophils Relative: 1.5 % (ref 0.0–3.0)
Eosinophils Absolute: 0.1 K/uL (ref 0.0–0.7)
Eosinophils Relative: 1.6 % (ref 0.0–5.0)
HCT: 35.5 % — ABNORMAL LOW (ref 39.0–52.0)
Hemoglobin: 12.1 g/dL — ABNORMAL LOW (ref 13.0–17.0)
Lymphocytes Relative: 15.8 % (ref 12.0–46.0)
Lymphs Abs: 0.9 K/uL (ref 0.7–4.0)
MCHC: 34 g/dL (ref 30.0–36.0)
MCV: 92.8 fl (ref 78.0–100.0)
Monocytes Absolute: 0.5 K/uL (ref 0.1–1.0)
Monocytes Relative: 8.4 % (ref 3.0–12.0)
Neutro Abs: 4 K/uL (ref 1.4–7.7)
Neutrophils Relative %: 72.7 % (ref 43.0–77.0)
Platelets: 300 K/uL (ref 150.0–400.0)
RBC: 3.82 Mil/uL — ABNORMAL LOW (ref 4.22–5.81)
RDW: 15.3 % (ref 11.5–15.5)
WBC: 5.4 K/uL (ref 4.0–10.5)

## 2024-08-28 LAB — COMPREHENSIVE METABOLIC PANEL WITH GFR
ALT: 11 U/L (ref 0–53)
AST: 23 U/L (ref 0–37)
Albumin: 4.5 g/dL (ref 3.5–5.2)
Alkaline Phosphatase: 65 U/L (ref 39–117)
BUN: 18 mg/dL (ref 6–23)
CO2: 27 meq/L (ref 19–32)
Calcium: 9.6 mg/dL (ref 8.4–10.5)
Chloride: 103 meq/L (ref 96–112)
Creatinine, Ser: 0.93 mg/dL (ref 0.40–1.50)
GFR: 80.13 mL/min (ref >60.00)
Glucose, Bld: 91 mg/dL (ref 70–99)
Potassium: 4.5 meq/L (ref 3.5–5.1)
Sodium: 139 meq/L (ref 135–145)
Total Bilirubin: 0.6 mg/dL (ref 0.2–1.2)
Total Protein: 7.1 g/dL (ref 6.0–8.3)

## 2024-08-28 LAB — URINALYSIS, ROUTINE W REFLEX MICROSCOPIC
Bilirubin, UA: NEGATIVE
Glucose, UA: NEGATIVE
Ketones, UA: NEGATIVE
Nitrite, UA: NEGATIVE
Protein,UA: NEGATIVE
Specific Gravity, UA: 1.01 (ref 1.005–1.030)
Urobilinogen, Ur: 0.2 mg/dL (ref 0.2–1.0)
pH, UA: 7 (ref 5.0–7.5)

## 2024-08-28 LAB — PSA: PSA: 2.06 ng/mL (ref 0.10–4.00)

## 2024-08-28 LAB — MICROSCOPIC EXAMINATION: RBC, Urine: >30 /HPF — AB (ref 0–2)

## 2024-08-28 LAB — TSH: TSH: 4.04 u[IU]/mL (ref 0.35–5.50)

## 2024-08-28 LAB — VITAMIN D 25 HYDROXY (VIT D DEFICIENCY, FRACTURES): VITD: 55.34 ng/mL (ref 30.00–100.00)

## 2024-08-28 LAB — SEDIMENTATION RATE: Sed Rate: 8 mm/h (ref 0–20)

## 2024-08-28 NOTE — Progress Notes (Signed)
 Assessment: 1. Malignant neoplasm of lateral wall of urinary bladder (HCC); high grade, T1   2. Gross hematuria     Plan: I discussed the pathology results with the patient and his wife today. Given the findings of high-grade T1 urothelial carcinoma, I discussed the recommended management with repeat TURBT for restaging.  I recommended that we consider this in the next 6-8 weeks. Information on bladder cancer provided. Will have him return to the office in approximately 1 month to schedule the next procedure.   Today, I personally spent 30 minutes in direct face to face time with the patient, of which greater than 50% of the time was spent in patient education and counseling as described above.  Chief Complaint:  Chief Complaint  Patient presents with   Bladder Cancer    History of Present Illness:  Austin Montilla. is a 73 y.o. male who is seen for further evaluation of gross hematuria. At his initial visit in April 2025, he reported passing some small clots approximately 2 weeks prior to his visit.  On 12/24/2023, he passed a large clot and had associated gross hematuria.  The gross hematuria resolved.  No other urinary symptoms. He reported some baseline frequency, urgency, and nocturia x 2. IPSS = 8/2.  Laboratory evaluation: U/A 12/18/23: 11-20 RBCs Urine culture: No growth U/A 12/25/23: Negative PSA 12/25/2023: 1.5  CT hematuria study from 01/15/2024 showed no renal or ureteral calculi, no evidence of renal mass or obstruction. Further evaluation with cystoscopy was recommended but the patient elected not to proceed.  No tobacco use.  No history of kidney stones or UTIs.  He has recently had a recurrence of his gross hematuria.  His symptoms began approximately 3 weeks ago.  He noted gross hematuria with associated clots.  He  had some discomfort with initiation of voiding.  He had continued symptoms of frequency and urgency.  No flank or abdominal pain.  No fevers or  chills.  His urine visibly cleared prior to his visit on 07/24/24. Urine culture from 07/16/2024 showed no growth. He was treated with Bactrim  DS. U/A 07/24/24: >30 WBCs, >30 RBCs Resolve Mdx culture: no growth He was evaluated with cystoscopy in the office on 08/02/2024. This showed an area of abnormal mucosa with papillary change the left lateral wall. Urine cytology showed dysplastic cells, FISH +.  He underwent cystoscopy, bilateral retrograde pyelograms, TURBT, and instillation of intravesical gemcitabine  on 08/13/2024. 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  He returns today for follow-up.  He is doing fairly well following the recent surgery.  His gross hematuria has resolved.  His frequency and urgency are improving.  Portions of the above documentation were copied from a prior visit for review purposes only.  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    Past Surgical History:  Past Surgical History:  Procedure Laterality  Date   BLADDER INSTILLATION N/A 08/13/2024   Procedure: INSTILLATION, BLADDER;  Surgeon: Roseann Adine PARAS., MD;  Location: [facility];  Service: Urology;  Laterality: N/A;   CYSTOSCOPY W/ RETROGRADES Bilateral 08/13/2024   Procedure: CYSTOSCOPY, WITH RETROGRADE PYELOGRAM;  Surgeon: Roseann Adine PARAS., MD;  Location: [facility];  Service: Urology;  Laterality: Bilateral;   TRANSURETHRAL RESECTION OF  BLADDER TUMOR N/A 08/13/2024   Procedure: TURBT (TRANSURETHRAL RESECTION OF BLADDER TUMOR);  Surgeon: Roseann Adine PARAS., MD;  Location: [facility];  Service: Urology;  Laterality: N/A;    Allergies:  Allergies  Allergen Reactions   Almond (Diagnostic)     Other Reaction(s): Unknown   Almond Meal (Obsolete) Swelling    Family History:  Family History  Problem Relation Age of Onset   Arthritis Mother    Heart disease Mother    Arthritis Father    Heart disease Father    Hypertension Father    ADD / ADHD Neg Hx     Social History:  Social History   Tobacco Use   Smoking status: Never   Smokeless tobacco: Never  Vaping Use   Vaping status: Never Used  Substance Use Topics   Alcohol use: Yes    Alcohol/week: 0.0 standard drinks of alcohol   Drug use: No    ROS: Constitutional:  Negative for fever, chills, weight loss CV: Negative for chest pain, previous MI, hypertension Respiratory:  Negative for shortness of breath, wheezing, sleep apnea, frequent cough GI:  Negative for nausea, vomiting, bloody stool, GERD  Physical exam: BP (!) 156/90   Pulse (!) 50   Ht 5' 7 (1.702 m)   Wt 153 lb (69.4 kg)   BMI 23.96 kg/m  GENERAL APPEARANCE:  Well appearing, well developed, well nourished, NAD HEENT:  Atraumatic, normocephalic, oropharynx clear NECK:  Supple without lymphadenopathy or thyromegaly ABDOMEN:  Soft, non-tender, no masses EXTREMITIES:  Moves all extremities well, without clubbing, cyanosis, or edema NEUROLOGIC:  Alert and oriented x 3, normal gait, CN II-XII grossly intact MENTAL STATUS:  appropriate BACK:  Non-tender to palpation, No CVAT SKIN:  Warm, dry, and intact  Results: U/A: 6-10 WBC, >30 RBC

## 2024-08-29 DIAGNOSIS — C672 Malignant neoplasm of lateral wall of bladder: Secondary | ICD-10-CM | POA: Insufficient documentation

## 2024-08-29 NOTE — Progress Notes (Signed)
 MyChart Video Visit    Virtual Visit via Video Note    Patient location: Home. Patient and provider in visit Provider location: Office  I discussed the limitations of evaluation and management by telemedicine and the availability of in person appointments. The patient expressed understanding and agreed to proceed.  Visit Date: 08/30/2024  Today's healthcare provider: Harlene LITTIE Jolly, NP     Subjective:    Patient ID: Austin Tessie Raddle., male    DOB: [birth year], 73 y.o.   MRN: [id number]  No chief complaint on file.   HPI   History of Present Illness Austin Barnette. is a 73 year old male with bladder cancer who presents for follow-up and lab work review to video visit. He is accompanied by his wife.  He was recently Dx with bladder cancer, currently exploring treatment options including BCG therapy per Urology.  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 He remains very active, exercising about two and a half hours daily on the treadmill and bike.  He is concerned that thyroid  dysfunction may be contributing to his fatigue. Recent labs included thyroid  function tests and sed rate, both were WNL.   He emphasizes nutrition, regular exercise, and stress management as part of a healthy lifestyle.  Patient denies fever, chills, SOB, CP, palpitations, dyspnea, edema, HA, vision changes, N/V/D, abdominal pain, urinary symptoms, rash, weight changes, and recent illness or hospitalizations.   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    Past Surgical History:  Procedure Laterality Date   BLADDER INSTILLATION N/A 08/13/2024   Procedure: INSTILLATION, BLADDER;  Surgeon: Roseann Adine PARAS., MD;  Location: [facility];  Service: Urology;  Laterality: N/A;   CYSTOSCOPY W/ RETROGRADES Bilateral 08/13/2024   Procedure: CYSTOSCOPY, WITH RETROGRADE PYELOGRAM;  Surgeon: Roseann Adine PARAS., MD;  Location: [facility];  Service: Urology;  Laterality: Bilateral;   TRANSURETHRAL RESECTION OF BLADDER TUMOR N/A 08/13/2024   Procedure: TURBT (TRANSURETHRAL RESECTION OF BLADDER TUMOR);  Surgeon: Roseann Adine PARAS., MD;  Location: [facility];  Service: Urology;  Laterality: N/A;    Family History  Problem Relation Age of Onset   Arthritis Mother    Heart disease Mother    Arthritis Father    Heart disease Father    Hypertension Father    ADD / ADHD Neg Hx     Social History   Socioeconomic History   Marital status: Married    Spouse name: Not on file   Number of children: Not on file   Years of education: Not on file   Highest education level: Some college, no degree  Occupational History   Occupation: Retired  Tobacco Use   Smoking status: Never   Smokeless tobacco: Never  Vaping Use   Vaping status: Never Used  Substance and Sexual Activity   Alcohol use: Yes  Alcohol/week: 0.0 standard drinks of alcohol   Drug use: No   Sexual activity: Yes    Comment: lives with wife, ultra marathon runner, retired from [employer] work,   Other Topics Concern   Not on file  Social History Narrative   Not on file   Social Drivers of Health   Financial Resource Strain: Low Risk  (01/20/2023)   Overall Financial Resource Strain (CARDIA)    Difficulty of Paying Living Expenses: Not hard at all  Food Insecurity: No Food Insecurity (01/20/2023)   Hunger Vital Sign    Worried About Running Out of  Food in the Last Year: Never true    Ran Out of Food in the Last Year: Never true  Transportation Needs: No Transportation Needs (01/20/2023)   PRAPARE - Administrator, Civil Service (Medical): No    Lack of Transportation (Non-Medical): No  Physical Activity: Sufficiently Active (01/20/2023)   Exercise Vital Sign    Days of Exercise per Week: 4 days    Minutes of Exercise per Session: 120 min  Stress: No Stress Concern Present (01/20/2023)   [employer] of Occupational Health - Occupational Stress Questionnaire    Feeling of Stress : Not at all  Social Connections: Socially Integrated (01/20/2023)   Social Connection and Isolation Panel    Frequency of Communication with Friends and Family: More than three times a week    Frequency of Social Gatherings with Friends and Family: More than three times a week    Attends Religious Services: More than 4 times per year    Active Member of [employer] or Organizations: Yes    Attends Banker Meetings: More than 4 times per year    Marital Status: Married  Catering Manager Violence: Not At Risk (09/09/2022)   Received from [facility]    Threatened: Not on file    Insulted: Not on file    Physically Hurt : Not on file    Scream: Not on file    Outpatient Medications Prior to Visit  Medication Sig Dispense Refill   carboxymethylcellulose (REFRESH PLUS) 0.5 % SOLN Place 1 drop into both eyes 3 (three) times daily as needed (dry eyes).     clobetasol  cream (TEMOVATE ) 0.05 % APPLY TO AFFECTED AREA DAILY AS NEEDED. 60 g 0   DHEA 10 MG CAPS Take 10 mg by mouth daily.     Multiple Vitamin (MULTIVITAMIN) tablet Take 1 tablet by mouth daily.     multivitamin-lutein (OCUVITE-LUTEIN) CAPS capsule Take 1 capsule by mouth daily.     Omega-3 Fatty Acids (FISH OIL PO) Take 1 capsule by mouth daily.     phenazopyridine  (PYRIDIUM ) 200 MG tablet Take 1 tablet (200 mg total) by mouth 3 (three) times daily as needed for  pain. (Patient not taking: Reported on 08/28/2024) 20 tablet 0   vitamin E 45 MG (100 UNITS) capsule Take 100 Units by mouth daily.     No facility-administered medications prior to visit.    Allergies  Allergen Reactions   Almond (Diagnostic)     Other Reaction(s): Unknown   Almond Meal (Obsolete) Swelling    ROS See HPI    Objective:    Physical Exam  There were no vitals taken for this visit. Wt Readings from Last 3 Encounters:  08/28/24 153 lb (69.4 kg)  08/13/24 159 lb 13.3 oz (72.5 kg)  08/12/24 159 lb 13.3 oz (72.5 kg)   General: Patient appears well-nourished and in  no acute distress. Alert and oriented to person and place. Engaged with provider throughout visit  HEENT: Head atraumatic and normocephalic. No facial asymmetry.. Pupils appear equal and reactive.  Respiratory: Breathing unlabored. No cough, wheezing, or respiratory distress observed. Respiratory rate appears normal. Neurological: Patient alert and oriented. Speech coherent. No facial droop, slurred speech, or focal deficits observed. Psychiatric: Mood and affect assessed via video. Patient cooperative. Thought process logical/coherent    Assessment & Plan:   Problem List Items Addressed This Visit     H/O: stroke   Malignant neoplasm of lateral wall of urinary bladder (HCC) - Primary    Recently diagnosed. Considering BCG immunotherapy. Following with Urology. Gathering information before decision. Offered referral for second opinion, Pt declines at this time.   Hx of stroke Residual right-sided weakness post-cerebral infarction.Continue regular physical activity and therapy.     I am having Austin Tessie Raddle. maintain his multivitamin, Omega-3 Fatty Acids (FISH OIL PO), multivitamin-lutein, DHEA, clobetasol  cream, carboxymethylcellulose, vitamin E, and phenazopyridine .  No orders of the defined types were placed in this encounter.   I discussed the assessment and treatment plan with the  patient. The patient was provided an opportunity to ask questions and all were answered. The patient agreed with the plan and demonstrated an understanding of the instructions.   The patient was advised to call back or seek an in-person evaluation if the symptoms worsen or if the condition fails to improve as anticipated.  I provided 20 minutes of face-to-face time during this encounter.   Harlene LITTIE Jolly, NP [facility] [phone] (phone) [phone] (fax)  [facility]

## 2024-08-30 ENCOUNTER — Encounter: Payer: Self-pay | Admitting: Student

## 2024-08-30 ENCOUNTER — Telehealth: Admitting: Student

## 2024-08-30 DIAGNOSIS — C672 Malignant neoplasm of lateral wall of bladder: Secondary | ICD-10-CM | POA: Diagnosis not present

## 2024-08-30 DIAGNOSIS — C4491 Basal cell carcinoma of skin, unspecified: Secondary | ICD-10-CM

## 2024-08-30 DIAGNOSIS — Z8673 Personal history of transient ischemic attack (TIA), and cerebral infarction without residual deficits: Secondary | ICD-10-CM

## 2024-08-30 NOTE — Addendum Note (Signed)
 Addended by: WHEELER HARLENE CROME on: 08/30/2024 03:39 PM   Modules accepted: Level of Service

## 2024-08-31 ENCOUNTER — Ambulatory Visit: Payer: Self-pay | Admitting: Family Medicine

## 2024-09-20 ENCOUNTER — Telehealth: Payer: Self-pay

## 2024-09-20 NOTE — Telephone Encounter (Signed)
 Copied from CRM #[id number]. Topic: Clinical - Medical Advice >> Sep 20, 2024  3:20 PM Chasity T wrote: Reason for CRM: Patient wife is calling requesting for Dr Domenica to give her a call for a personal reason. She states she does not want to speak with her PA and that she wants to talk directly to her.   Phone number: [phone]

## 2024-09-23 ENCOUNTER — Telehealth: Payer: Self-pay

## 2024-09-23 DIAGNOSIS — E782 Mixed hyperlipidemia: Secondary | ICD-10-CM

## 2024-09-23 NOTE — Telephone Encounter (Signed)
 Per Dr. Domenica: Austin Ramirez dob [birth year] needs a lab appt in 4-6 mn then a f/u with me after that for a cmp, cbc, tsh.

## 2024-09-23 NOTE — Telephone Encounter (Signed)
Patient returned call and schedule/d appointments

## 2024-09-23 NOTE — Telephone Encounter (Signed)
 Called pt and no answer left message to return call to be scheduled for lab/ office appointment. Lab orders placed for future.

## 2024-10-02 ENCOUNTER — Ambulatory Visit: Admitting: Urology

## 2024-10-08 ENCOUNTER — Ambulatory Visit

## 2024-10-08 VITALS — Ht 67.0 in | Wt 153.0 lb

## 2024-10-08 DIAGNOSIS — Z0000 Encounter for general adult medical examination without abnormal findings: Secondary | ICD-10-CM

## 2024-10-08 NOTE — Progress Notes (Addendum)
 "  Chief Complaint  Patient presents with   Medicare Wellness     Subjective:   Austin Ramirez. is a 74 y.o. male who presents for a Medicare Annual Wellness Visit.  Visit info / Clinical Intake: Medicare Wellness Visit Type:: Subsequent Annual Wellness Visit Persons participating in visit and providing information:: patient Medicare Wellness Visit Mode:: Video Since this visit was completed virtually, some vitals may be partially provided or unavailable. Missing vitals are due to the limitations of the virtual format.: Documented vitals are patient reported If Telephone or Video please confirm:: I connected with patient using audio/video enable telemedicine. I verified patient identity with two identifiers, discussed telehealth limitations, and patient agreed to proceed. Patient Location:: Home Provider Location:: Office Interpreter Needed?: No Pre-visit prep was completed: yes AWV questionnaire completed by patient prior to visit?: yes Date:: 10/09/23 Living arrangements:: lives with spouse/significant other Patient's Overall Health Status Rating: excellent Typical amount of pain: none Does pain affect daily life?: no Are you currently prescribed opioids?: no  Dietary Habits and Nutritional Risks How many meals a day?: 2 Eats fruit and vegetables daily?: yes Most meals are obtained by: preparing own meals In the last 2 weeks, have you had any of the following?: none Diabetic:: no  Functional Status Activities of Daily Living (to include ambulation/medication): Independent Ambulation: Independent with device- listed below Medication Administration: Independent Home Management (perform basic housework or laundry): Independent Manage your own finances?: yes Primary transportation is: driving Concerns about vision?: no *vision screening is required for WTM* Concerns about hearing?: no  Fall Screening Falls in the past year?: 0 Number of falls in past year: 0 Was there  an injury with Fall?: 0 Fall Risk Category Calculator: 0 Patient Fall Risk Level: Low Fall Risk  Fall Risk Patient at Risk for Falls Due to: No Fall Risks Fall risk Follow up: Falls evaluation completed  Home and Transportation Safety: All rugs have non-skid backing?: yes All stairs or steps have railings?: yes Grab bars in the bathtub or shower?: yes Have non-skid surface in bathtub or shower?: yes Good home lighting?: yes Regular seat belt use?: yes Hospital stays in the last year:: no  Cognitive Assessment Difficulty concentrating, remembering, or making decisions? : no Will 6CIT or Mini Cog be Completed: yes What year is it?: 0 points What month is it?: 0 points Give patient an address phrase to remember (5 components): [address] [place]  About what time is it?: 0 points Count backwards from 20 to 1: 0 points Say the months of the year in reverse: 0 points Repeat the address phrase from earlier: 0 points 6 CIT Score: 0 points  Advance Directives (For Healthcare) Does Patient Have a Medical Advance Directive?: Yes Does patient want to make changes to medical advance directive?: No - Patient declined Type of Advance Directive: Healthcare Power of [name]; Living will Copy of Healthcare Power of Attorney in Chart?: Yes - validated most recent copy scanned in chart (See row information) Copy of Living Will in Chart?: Yes - validated most recent copy scanned in chart (See row information)  Reviewed/Updated  Reviewed/Updated: Reviewed All (Medical, Surgical, Family, Medications, Allergies, Care Teams, Patient Goals)    Allergies (verified) Almond (diagnostic) and Almond meal (obsolete)   Current Medications (verified) Outpatient Encounter Medications as of 10/08/2024  Medication Sig   carboxymethylcellulose (REFRESH PLUS) 0.5 % SOLN Place 1 drop into both eyes 3 (three) times daily as needed (dry eyes).   clobetasol  cream (TEMOVATE ) 0.05 %  APPLY TO AFFECTED  AREA DAILY AS NEEDED.   DHEA 10 MG CAPS Take 10 mg by mouth daily.   Multiple Vitamin (MULTIVITAMIN) tablet Take 1 tablet by mouth daily.   multivitamin-lutein (OCUVITE-LUTEIN) CAPS capsule Take 1 capsule by mouth daily.   Omega-3 Fatty Acids (FISH OIL PO) Take 1 capsule by mouth daily.   phenazopyridine  (PYRIDIUM ) 200 MG tablet Take 1 tablet (200 mg total) by mouth 3 (three) times daily as needed for pain. (Patient not taking: Reported on 08/28/2024)   vitamin E 45 MG (100 UNITS) capsule Take 100 Units by mouth daily.   No facility-administered encounter medications on file as of 10/08/2024.    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   Past Surgical History:  Procedure Laterality Date   BLADDER INSTILLATION N/A 08/13/2024   Procedure: INSTILLATION, BLADDER;  Surgeon: Roseann Adine PARAS., MD;  Location: [facility];  Service: Urology;  Laterality: N/A;   CYSTOSCOPY W/ RETROGRADES Bilateral 08/13/2024   Procedure: CYSTOSCOPY, WITH RETROGRADE PYELOGRAM;  Surgeon: Roseann Adine PARAS., MD;  Location: [facility];  Service: Urology;  Laterality: Bilateral;   TRANSURETHRAL RESECTION OF BLADDER TUMOR N/A 08/13/2024   Procedure: TURBT (TRANSURETHRAL RESECTION OF BLADDER TUMOR);  Surgeon: Roseann Adine PARAS., MD;  Location: [facility];  Service: Urology;  Laterality: N/A;   Family History  Problem Relation Age of Onset   Arthritis Mother    Heart disease Mother    Arthritis Father    Heart disease  Father    Hypertension Father    ADD / ADHD Neg Hx    Social History   Occupational History   Occupation: Retired  Tobacco Use   Smoking status: Never   Smokeless tobacco: Never  Vaping Use   Vaping status: Never Used  Substance and Sexual Activity   Alcohol use: Yes    Alcohol/week: 0.0 standard drinks of alcohol   Drug use: No   Sexual activity: Yes    Comment: lives with wife, ultra marathon runner, retired from estate manager/land agent work,    Tobacco Counseling Counseling given: No  SDOH Screenings   Food Insecurity: No Food Insecurity (10/08/2024)  Housing: Unknown (10/08/2024)  Transportation Needs: No Transportation Needs (10/08/2024)  Utilities: Not At Risk (10/08/2024)  Alcohol Screen: Low Risk (01/20/2023)  Depression (PHQ2-9): Low Risk (10/08/2024)  Financial Resource Strain: Low Risk (01/20/2023)  Physical Activity: Sufficiently Active (10/08/2024)  Social Connections: Socially Integrated (10/08/2024)  Stress: No Stress Concern Present (10/08/2024)  Tobacco Use: Low Risk (10/08/2024)  Health Literacy: Adequate Health Literacy (10/08/2024)   See flowsheets for full screening details  Depression Screen PHQ 2 & 9 Depression Scale- Over the past 2 weeks, how often have you been bothered by any of the following problems? Little interest or pleasure in doing things: 0 Feeling down, depressed, or hopeless (PHQ Adolescent also includes...irritable): 0 PHQ-2 Total Score: 0 Trouble falling or staying asleep, or sleeping too much: 0  Feeling tired or having little energy: 0 Poor appetite or overeating (PHQ Adolescent also includes...weight loss): 0 Feeling bad about yourself - or that you are a failure or have let yourself or your family down: 0 Trouble concentrating on things, such as reading the newspaper or watching television (PHQ Adolescent also includes...like school work): 0 Moving or speaking so slowly that other people could have noticed. Or the opposite - being so fidgety or  restless that you have been moving around a lot more than usual: 0 Thoughts that you would be better off dead, or of hurting yourself in some way: 0 PHQ-9 Total Score: 0 If you checked off any problems, how difficult have these problems made it for you to do your work, take care of things at home, or get along with other people?: Not difficult at all     Goals Addressed               This Visit's Progress     Continue physical activity (pt-stated)        Remain active!             Objective:    Today's Vitals   10/08/24 0818  Weight: 153 lb (69.4 kg)  Height: 5' 7 (1.702 m)   Body mass index is 23.96 kg/m.  Hearing/Vision screen Hearing Screening - Comments:: Denies hearing difficulties   Vision Screening - Comments:: Wears rx glasses - up to date with routine eye exams with [facility]  Immunizations and Health Maintenance Health Maintenance  Topic Date Due   Hepatitis C Screening  Never done   Influenza Vaccine  Never done   COVID-19 Vaccine (5 - 2025-26 season) 05/27/2024   Zoster Vaccines- Shingrix (1 of 2) 10/23/2026 (Originally 10/23/1969)   Pneumococcal Vaccine: 50+ Years (1 of 2 - PCV) 07/24/2027 (Originally 10/23/1969)   DTaP/Tdap/Td (2 - Td or Tdap) 01/19/2025   Medicare Annual Wellness (AWV)  10/08/2025   Fecal DNA (Cologuard)  08/28/2026   Meningococcal B Vaccine  Aged Out   Hepatitis B Vaccines 19-59 Average Risk  Discontinued        Assessment/Plan:  This is a routine wellness examination for Austin Ramirez.  Patient Care Team: Domenica Harlene LABOR, MD as PCP - General (Family Medicine)  I have personally reviewed and noted the following in the patients chart:   Medical and social history Use of alcohol, tobacco or illicit drugs  Current medications and supplements including opioid prescriptions. Functional ability and status Nutritional status Physical activity Advanced directives List of other physicians Hospitalizations, surgeries, and  ER visits in previous 12 months Vitals Screenings to include cognitive, depression, and falls Referrals and appointments  No orders of the defined types were placed in this encounter.  In addition, I have reviewed and discussed with patient certain preventive protocols, quality metrics, and best practice recommendations. A written personalized care plan for preventive services as well as general preventive health recommendations were provided to patient.   Austin LELON Blush, LPN   [id number]/[id number]   Return in 53 weeks (on 10/14/2025).  After Visit Summary: (MyChart) Due to this being a telephonic visit, the after visit summary with patients personalized plan was offered to patient via MyChart   Nurse Notes: No voiced or noted concerns at this time "

## 2024-10-08 NOTE — Patient Instructions (Addendum)
 Mr. Austin Ramirez,  Thank you for taking the time for your Medicare Wellness Visit. I appreciate your continued commitment to your health goals. Please review the care plan we discussed, and feel free to reach out if I can assist you further.  Please note that Annual Wellness Visits do not include a physical exam. Some assessments may be limited, especially if the visit was conducted virtually. If needed, we may recommend an in-person follow-up with your provider.  Ongoing Care Seeing your primary care provider every 3 to 6 months helps us  monitor your health and provide consistent, personalized care.   Referrals If a referral was made during today's visit and you haven't received any updates within two weeks, please contact the referred provider directly to check on the status.  Recommended Screenings:  Health Maintenance  Topic Date Due   Hepatitis C Screening  Never done   Flu Shot  Never done   COVID-19 Vaccine (5 - 2025-26 season) 05/27/2024   Zoster (Shingles) Vaccine (1 of 2) 10/23/2026*   Pneumococcal Vaccine for age over 70 (1 of 2 - PCV) 07/24/2027*   DTaP/Tdap/Td vaccine (2 - Td or Tdap) 01/19/2025   Medicare Annual Wellness Visit  10/08/2025   Cologuard (Stool DNA test)  08/28/2026   Meningitis B Vaccine  Aged Out   Hepatitis B Vaccine  Discontinued  *Topic was postponed. The date shown is not the original due date.       10/08/2024    8:20 AM  Advanced Directives  Does Patient Have a Medical Advance Directive? Yes  Type of Estate Agent of [place];Living will  Does patient want to make changes to medical advance directive? No - Patient declined  Copy of Healthcare Power of Attorney in Chart? Yes - validated most recent copy scanned in chart (See row information)    Vision: Annual vision screenings are recommended for early detection of glaucoma, cataracts, and diabetic retinopathy. These exams can also reveal signs of chronic conditions such as  diabetes and high blood pressure.  Dental: Annual dental screenings help detect early signs of oral cancer, gum disease, and other conditions linked to overall health, including heart disease and diabetes.  Please see the attached documents for additional preventive care recommendations.

## 2024-10-16 ENCOUNTER — Encounter: Payer: Self-pay | Admitting: Urology

## 2025-02-10 ENCOUNTER — Other Ambulatory Visit

## 2025-02-20 ENCOUNTER — Ambulatory Visit: Admitting: Family Medicine

## 2025-10-14 ENCOUNTER — Ambulatory Visit
